# Patient Record
Sex: Female | Born: 1945 | Race: White | Hispanic: No | Marital: Single | Smoking: Never smoker
Health system: Southern US, Community
[De-identification: ages and names within clinical notes are randomized; demographics above are authoritative.]

---

## 2005-02-25 ENCOUNTER — Ambulatory Visit: Payer: Self-pay | Admitting: Unknown Physician Specialty

## 2005-07-14 ENCOUNTER — Ambulatory Visit: Payer: Self-pay | Admitting: General Surgery

## 2006-08-11 ENCOUNTER — Ambulatory Visit: Payer: Self-pay | Admitting: General Surgery

## 2007-07-24 ENCOUNTER — Ambulatory Visit: Payer: Self-pay | Admitting: General Surgery

## 2008-08-08 ENCOUNTER — Ambulatory Visit: Payer: Self-pay | Admitting: General Surgery

## 2009-08-10 ENCOUNTER — Ambulatory Visit: Payer: Self-pay | Admitting: General Surgery

## 2019-06-03 ENCOUNTER — Encounter: Payer: Self-pay | Admitting: Podiatry

## 2019-06-03 ENCOUNTER — Other Ambulatory Visit: Payer: Self-pay

## 2019-06-03 ENCOUNTER — Ambulatory Visit (INDEPENDENT_AMBULATORY_CARE_PROVIDER_SITE_OTHER): Payer: Medicare Other | Admitting: Podiatry

## 2019-06-03 VITALS — Temp 98.0°F

## 2019-06-03 DIAGNOSIS — M79676 Pain in unspecified toe(s): Secondary | ICD-10-CM

## 2019-06-03 DIAGNOSIS — B351 Tinea unguium: Secondary | ICD-10-CM

## 2019-06-03 NOTE — Progress Notes (Signed)
  Subjective:  Patient ID: Crystal Bartlett, female    DOB: 1946/09/24,  MRN: [id number] HPI Chief Complaint  Patient presents with  . Nail Problem    nail trim, long thick curvy nails, possible ingrown toenail left hallux.  No treatment done    73 y.o. female presents with the above complaint.   ROS: Denies fever chills nausea vomiting muscle aches pains calf pain back pain chest pain shortness of breath.  No past medical history on file.   Current Outpatient Medications:  .  acetaminophen (TYLENOL) 325 MG tablet, Take 650 mg by mouth every 6 (six) hours as needed., Disp: , Rfl:   No Known Allergies Review of Systems Objective:   Vitals:   06/03/19 0816  Temp: 62 F (36.7 C)    General: Well developed, nourished, in no acute distress, alert and oriented x3   Dermatological: Skin is warm, dry and supple bilateral. Nails x 10 are well maintained; remaining integument appears unremarkable at this time. There are no open sores, no preulcerative lesions, no rash or signs of infection present.  Toenails are grossly thickened and elongated approximately 2-1/2 inches long from the tip of the toe.  No open lesions or wounds  Vascular: Dorsalis Pedis artery and Posterior Tibial artery pedal pulses are 2/4 bilateral with immedate capillary fill time. Pedal hair growth present. No varicosities and no lower extremity edema present bilateral.   Neruologic: Grossly intact via light touch bilateral. Vibratory intact via tuning fork bilateral. Protective threshold with Semmes Wienstein monofilament intact to all pedal sites bilateral. Patellar and Achilles deep tendon reflexes 2+ bilateral. No Babinski or clonus noted bilateral.   Musculoskeletal: No gross boney pedal deformities bilateral. No pain, crepitus, or limitation noted with foot and ankle range of motion bilateral. Muscular strength 5/5 in all groups tested bilateral.  Gait: Unassisted, Nonantalgic.    Radiographs:  None taken   Assessment & Plan:   Assessment: Grossly elongated painful mycotic nails  Plan: Debrided toenails 1 through 5 bilateral reappoint to Dr. Sharyon Cable.     Max T. [place], [place]

## 2019-09-02 ENCOUNTER — Ambulatory Visit (INDEPENDENT_AMBULATORY_CARE_PROVIDER_SITE_OTHER): Payer: Medicare Other | Admitting: Podiatry

## 2019-09-02 ENCOUNTER — Other Ambulatory Visit: Payer: Self-pay

## 2019-09-02 ENCOUNTER — Encounter: Payer: Self-pay | Admitting: Podiatry

## 2019-09-02 DIAGNOSIS — M79676 Pain in unspecified toe(s): Secondary | ICD-10-CM | POA: Diagnosis not present

## 2019-09-02 DIAGNOSIS — B351 Tinea unguium: Secondary | ICD-10-CM

## 2019-09-02 NOTE — Progress Notes (Signed)
Complaint:  Visit Type: Patient returns to my office for continued preventative foot care services. Complaint: Patient states" my nails have grown long and thick and become painful to walk and wear shoes" . The patient presents for preventative foot care services. No changes to ROS  Podiatric Exam: Vascular: dorsalis pedis and posterior tibial pulses are palpable bilateral. Capillary return is immediate. Temperature gradient is WNL. Skin turgor WNL  Sensorium: Normal Semmes Weinstein monofilament test. Normal tactile sensation bilaterally. Nail Exam: Pt has thick disfigured discolored nails with subungual debris noted bilateral entire nail hallux through fifth toenails Ulcer Exam: There is no evidence of ulcer or pre-ulcerative changes or infection. Orthopedic Exam: Muscle tone and strength are WNL. No limitations in general ROM. No crepitus or effusions noted. Foot type and digits show no abnormalities. Bony prominences are unremarkable. Skin: No Porokeratosis. No infection or ulcers  Diagnosis:  Onychomycosis, , Pain in right toe, pain in left toes  Treatment & Plan Procedures and Treatment: Consent by patient was obtained for treatment procedures.   Debridement of mycotic and hypertrophic toenails, 1 through 5 bilateral and clearing of subungual debris. No ulceration, no infection noted.  Return Visit-Office Procedure: Patient instructed to return to the office for a follow up visit 3 months for continued evaluation and treatment.    Ameliyah Sarno DPM 

## 2019-12-02 ENCOUNTER — Ambulatory Visit (INDEPENDENT_AMBULATORY_CARE_PROVIDER_SITE_OTHER): Payer: Medicare Other | Admitting: Podiatry

## 2019-12-02 ENCOUNTER — Other Ambulatory Visit: Payer: Self-pay

## 2019-12-02 ENCOUNTER — Encounter: Payer: Self-pay | Admitting: Podiatry

## 2019-12-02 DIAGNOSIS — M79676 Pain in unspecified toe(s): Secondary | ICD-10-CM

## 2019-12-02 DIAGNOSIS — B351 Tinea unguium: Secondary | ICD-10-CM

## 2019-12-02 NOTE — Progress Notes (Signed)
Complaint:  Visit Type: Patient returns to my office for continued preventative foot care services. Complaint: Patient states" my nails have grown long and thick and become painful to walk and wear shoes" . The patient presents for preventative foot care services. No changes to ROS  Podiatric Exam: Vascular: dorsalis pedis and posterior tibial pulses are palpable bilateral. Capillary return is immediate. Temperature gradient is WNL. Skin turgor WNL  Sensorium: Normal Semmes Weinstein monofilament test. Normal tactile sensation bilaterally. Nail Exam: Pt has thick disfigured discolored nails with subungual debris noted bilateral entire nail hallux through fifth toenails Ulcer Exam: There is no evidence of ulcer or pre-ulcerative changes or infection. Orthopedic Exam: Muscle tone and strength are WNL. No limitations in general ROM. No crepitus or effusions noted. Foot type and digits show no abnormalities. Bony prominences are unremarkable. Skin: No Porokeratosis. No infection or ulcers  Diagnosis:  Onychomycosis, , Pain in right toe, pain in left toes  Treatment & Plan Procedures and Treatment: Consent by patient was obtained for treatment procedures.   Debridement of mycotic and hypertrophic toenails, 1 through 5 bilateral and clearing of subungual debris. No ulceration, no infection noted.  Return Visit-Office Procedure: Patient instructed to return to the office for a follow up visit 3 months for continued evaluation and treatment.    Oaklee Sunga DPM 

## 2020-03-02 ENCOUNTER — Ambulatory Visit: Payer: Medicare Other | Admitting: Podiatry

## 2020-03-09 ENCOUNTER — Other Ambulatory Visit: Payer: Self-pay

## 2020-03-09 ENCOUNTER — Ambulatory Visit (INDEPENDENT_AMBULATORY_CARE_PROVIDER_SITE_OTHER): Payer: Medicare Other | Admitting: Podiatry

## 2020-03-09 ENCOUNTER — Encounter: Payer: Self-pay | Admitting: Podiatry

## 2020-03-09 VITALS — Temp 97.7°F

## 2020-03-09 DIAGNOSIS — B351 Tinea unguium: Secondary | ICD-10-CM

## 2020-03-09 DIAGNOSIS — M79676 Pain in unspecified toe(s): Secondary | ICD-10-CM | POA: Diagnosis not present

## 2020-03-09 NOTE — Progress Notes (Signed)
This patient returns to the office for evaluation and treatment of long thick painful nails .  This patient is unable to trim his own nails since the patient cannot reach the feet.  Patient says the nails are painful walking and wearing his shoes.  She returns for preventive foot care services.  General Appearance  Alert, conversant and in no acute stress.  Vascular  Dorsalis pedis and posterior tibial  pulses are palpable  bilaterally.  Capillary return is within normal limits  bilaterally. Temperature is within normal limits  bilaterally.  Neurologic  Senn-Weinstein monofilament wire test within normal limits  bilaterally. Muscle power within normal limits bilaterally.  Nails Thick disfigured discolored nails with subungual debris  from hallux to fifth toes bilaterally. No evidence of bacterial infection or drainage bilaterally.  Orthopedic  No limitations of motion  feet .  No crepitus or effusions noted.  No bony pathology or digital deformities noted.  Skin  normotropic skin with no porokeratosis noted bilaterally.  No signs of infections or ulcers noted.     Onychomycosis  Pain in toes right foot  Pain in toes left foot  Debridement  of nails  1-5  B/L with a nail nipper.  Nails were then filed using a dremel tool with no incidents.    RTC 3 months    Earnest Mcgillis DPM  

## 2020-06-11 ENCOUNTER — Ambulatory Visit: Payer: Medicare Other | Admitting: Podiatry

## 2020-06-18 ENCOUNTER — Ambulatory Visit: Payer: Medicare Other | Admitting: Podiatry

## 2020-06-25 ENCOUNTER — Ambulatory Visit (INDEPENDENT_AMBULATORY_CARE_PROVIDER_SITE_OTHER): Payer: Medicare Other | Admitting: Podiatry

## 2020-06-25 ENCOUNTER — Encounter: Payer: Self-pay | Admitting: Podiatry

## 2020-06-25 ENCOUNTER — Other Ambulatory Visit: Payer: Self-pay

## 2020-06-25 DIAGNOSIS — M79676 Pain in unspecified toe(s): Secondary | ICD-10-CM | POA: Diagnosis not present

## 2020-06-25 DIAGNOSIS — B351 Tinea unguium: Secondary | ICD-10-CM

## 2020-06-25 NOTE — Progress Notes (Signed)
This patient returns to the office for evaluation and treatment of long thick painful nails .  This patient is unable to trim his own nails since the patient cannot reach the feet.  Patient says the nails are painful walking and wearing his shoes.  She returns for preventive foot care services.  General Appearance  Alert, conversant and in no acute stress.  Vascular  Dorsalis pedis and posterior tibial  pulses are palpable  bilaterally.  Capillary return is within normal limits  bilaterally. Temperature is within normal limits  bilaterally.  Neurologic  Senn-Weinstein monofilament wire test within normal limits  bilaterally. Muscle power within normal limits bilaterally.  Nails Thick disfigured discolored nails with subungual debris  from hallux to fifth toes bilaterally. No evidence of bacterial infection or drainage bilaterally.  Orthopedic  No limitations of motion  feet .  No crepitus or effusions noted.  No bony pathology or digital deformities noted.  Skin  normotropic skin with no porokeratosis noted bilaterally.  No signs of infections or ulcers noted.     Onychomycosis  Pain in toes right foot  Pain in toes left foot  Debridement  of nails  1-5  B/L with a nail nipper.  Nails were then filed using a dremel tool with no incidents.    RTC 3 months    Helane Gunther DPM

## 2020-10-08 ENCOUNTER — Other Ambulatory Visit: Payer: Self-pay

## 2020-10-08 ENCOUNTER — Encounter: Payer: Self-pay | Admitting: Podiatry

## 2020-10-08 ENCOUNTER — Ambulatory Visit (INDEPENDENT_AMBULATORY_CARE_PROVIDER_SITE_OTHER): Payer: Medicare Other | Admitting: Podiatry

## 2020-10-08 DIAGNOSIS — B351 Tinea unguium: Secondary | ICD-10-CM

## 2020-10-08 DIAGNOSIS — M79676 Pain in unspecified toe(s): Secondary | ICD-10-CM | POA: Diagnosis not present

## 2020-10-08 NOTE — Progress Notes (Signed)
This patient returns to the office for evaluation and treatment of long thick painful nails .  This patient is unable to trim his own nails since the patient cannot reach the feet.  Patient says the nails are painful walking and wearing his shoes.  She returns for preventive foot care services.  General Appearance  Alert, conversant and in no acute stress.  Vascular  Dorsalis pedis and posterior tibial  pulses are palpable  bilaterally.  Capillary return is within normal limits  bilaterally. Temperature is within normal limits  bilaterally.  Neurologic  Senn-Weinstein monofilament wire test within normal limits  bilaterally. Muscle power within normal limits bilaterally.  Nails Thick disfigured discolored nails with subungual debris  from hallux to fifth toes bilaterally. No evidence of bacterial infection or drainage bilaterally.  Orthopedic  No limitations of motion  feet .  No crepitus or effusions noted.  No bony pathology or digital deformities noted.  Skin  normotropic skin with no porokeratosis noted bilaterally.  No signs of infections or ulcers noted.     Onychomycosis  Pain in toes right foot  Pain in toes left foot  Debridement  of nails  1-5  B/L with a nail nipper.  Nails were then filed using a dremel tool with no incidents.    RTC 3 months    Helane Gunther DPM

## 2021-01-11 ENCOUNTER — Other Ambulatory Visit: Payer: Self-pay

## 2021-01-11 ENCOUNTER — Encounter: Payer: Self-pay | Admitting: Podiatry

## 2021-01-11 ENCOUNTER — Ambulatory Visit (INDEPENDENT_AMBULATORY_CARE_PROVIDER_SITE_OTHER): Payer: Medicare Other | Admitting: Podiatry

## 2021-01-11 DIAGNOSIS — B351 Tinea unguium: Secondary | ICD-10-CM | POA: Diagnosis not present

## 2021-01-11 DIAGNOSIS — M79676 Pain in unspecified toe(s): Secondary | ICD-10-CM | POA: Diagnosis not present

## 2021-01-11 NOTE — Progress Notes (Signed)

## 2021-02-09 ENCOUNTER — Other Ambulatory Visit: Payer: Self-pay | Admitting: Internal Medicine

## 2021-02-09 DIAGNOSIS — Z1231 Encounter for screening mammogram for malignant neoplasm of breast: Secondary | ICD-10-CM

## 2021-03-23 ENCOUNTER — Ambulatory Visit
Admission: RE | Admit: 2021-03-23 | Discharge: 2021-03-23 | Disposition: A | Discharge status: Home / Self Care | Payer: Medicare Other | Source: Ambulatory Visit | Attending: Internal Medicine | Admitting: Internal Medicine

## 2021-03-23 ENCOUNTER — Other Ambulatory Visit: Payer: Self-pay

## 2021-03-23 DIAGNOSIS — Z1231 Encounter for screening mammogram for malignant neoplasm of breast: Secondary | ICD-10-CM

## 2021-04-02 ENCOUNTER — Encounter: Payer: Self-pay | Admitting: Podiatry

## 2021-04-12 ENCOUNTER — Ambulatory Visit: Payer: Medicare Other | Admitting: Podiatry

## 2021-04-15 ENCOUNTER — Ambulatory Visit: Payer: Medicare Other | Admitting: Podiatry

## 2021-05-06 ENCOUNTER — Other Ambulatory Visit: Payer: Self-pay

## 2021-05-06 ENCOUNTER — Ambulatory Visit (INDEPENDENT_AMBULATORY_CARE_PROVIDER_SITE_OTHER): Payer: Medicare Other | Admitting: Podiatry

## 2021-05-06 ENCOUNTER — Encounter: Payer: Self-pay | Admitting: Podiatry

## 2021-05-06 DIAGNOSIS — B351 Tinea unguium: Secondary | ICD-10-CM | POA: Diagnosis not present

## 2021-05-06 DIAGNOSIS — M79676 Pain in unspecified toe(s): Secondary | ICD-10-CM | POA: Diagnosis not present

## 2021-05-06 NOTE — Progress Notes (Signed)

## 2021-06-24 IMAGING — MG MM DIGITAL SCREENING BILAT W/ TOMO AND CAD
6 of 10 series · 6 of 30 positions shown · non-contrast
Comparison: Previous exam(s).

CLINICAL DATA: Screening.

EXAM:
DIGITAL SCREENING BILATERAL MAMMOGRAM WITH TOMOSYNTHESIS AND CAD
TECHNIQUE: Bilateral screening digital craniocaudal and mediolateral oblique
mammograms were obtained. Bilateral screening digital breast
tomosynthesis was performed. The images were evaluated with
computer-aided detection.

[L MLO synth-2D]
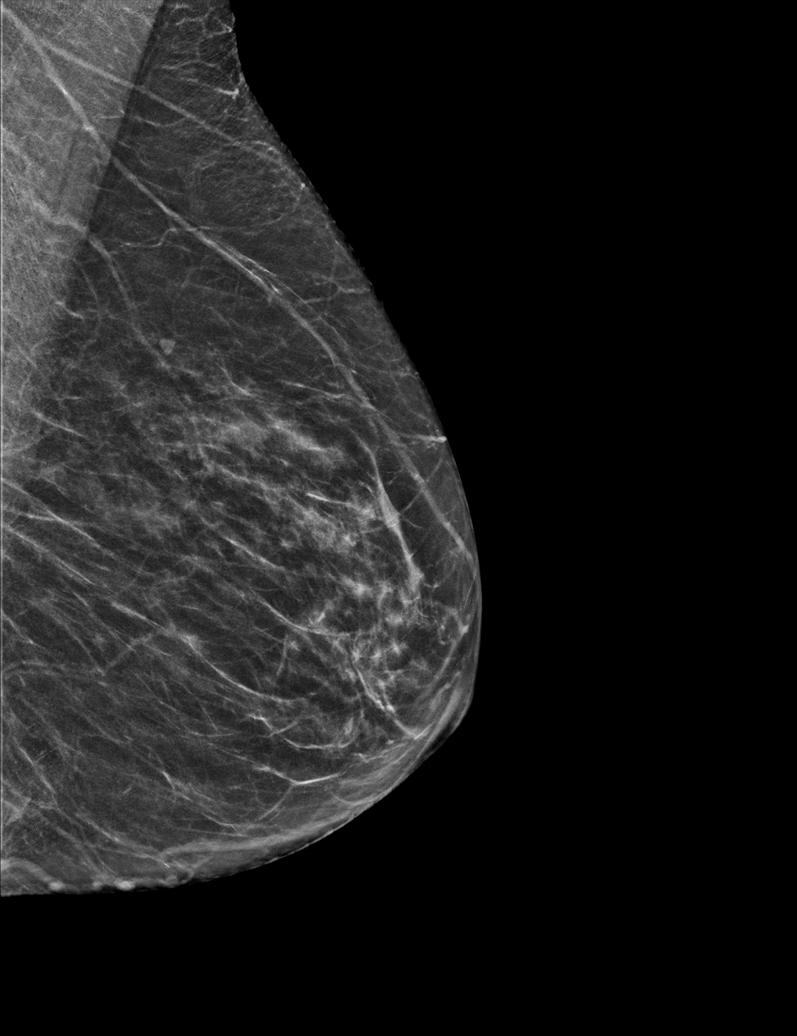

[R MLO synth-2D (1 of 2)]
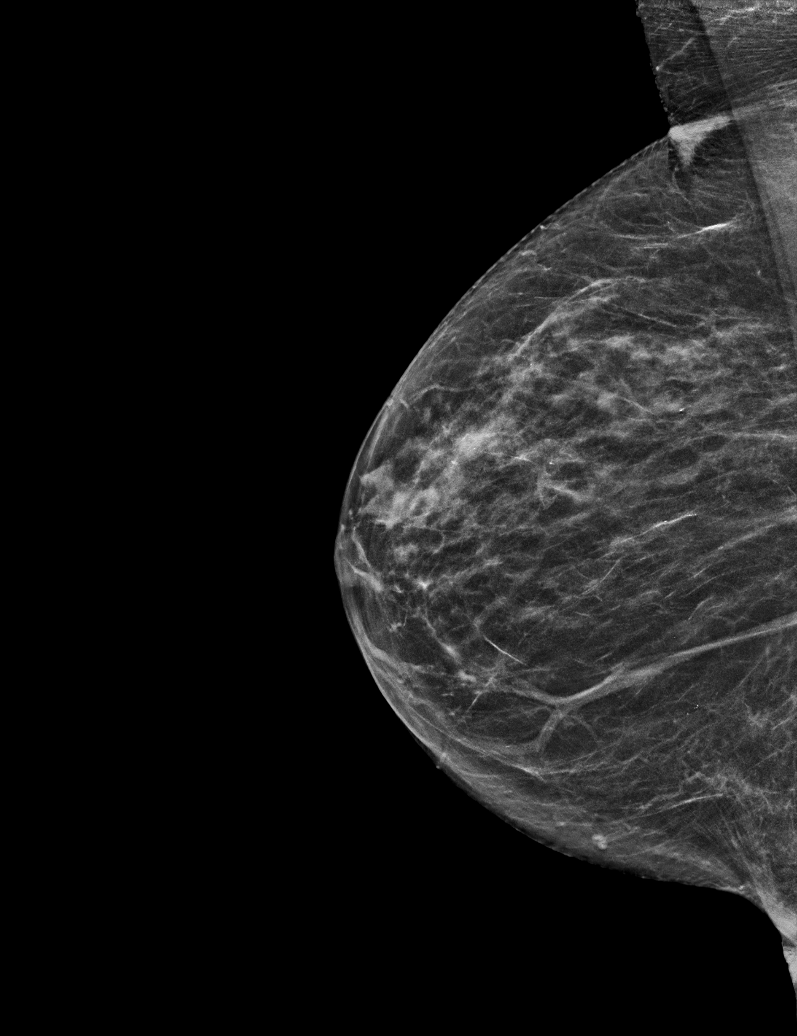

[R CC synth-2D]
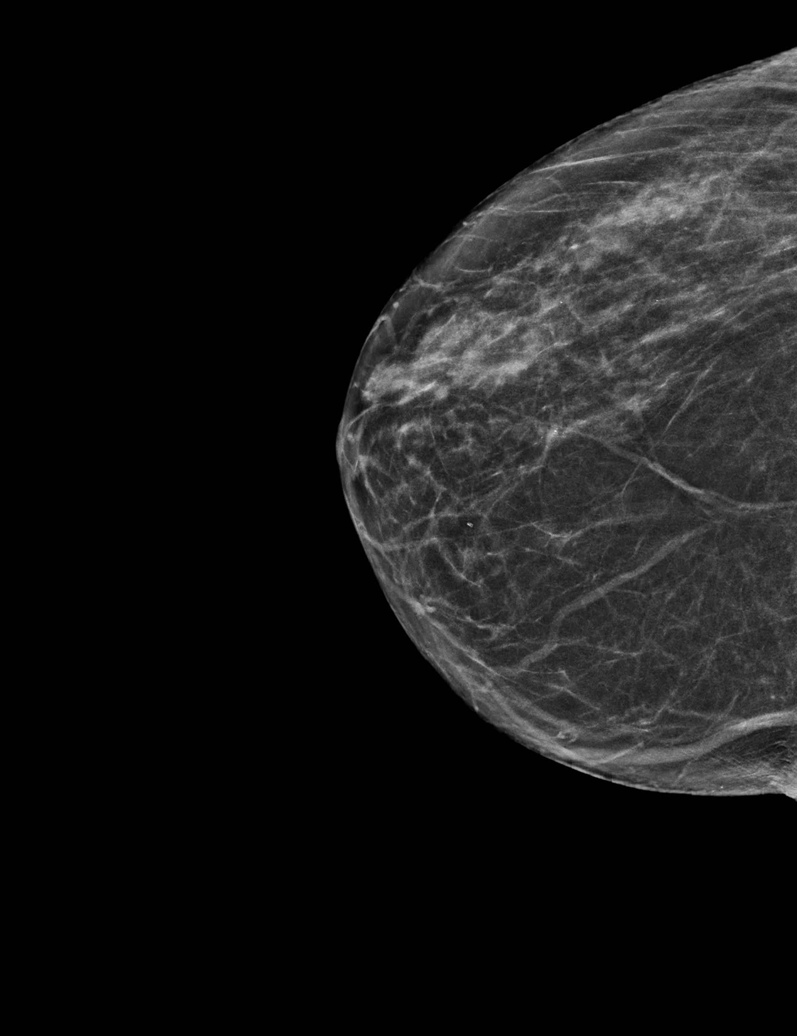

[L CC synth-2D]
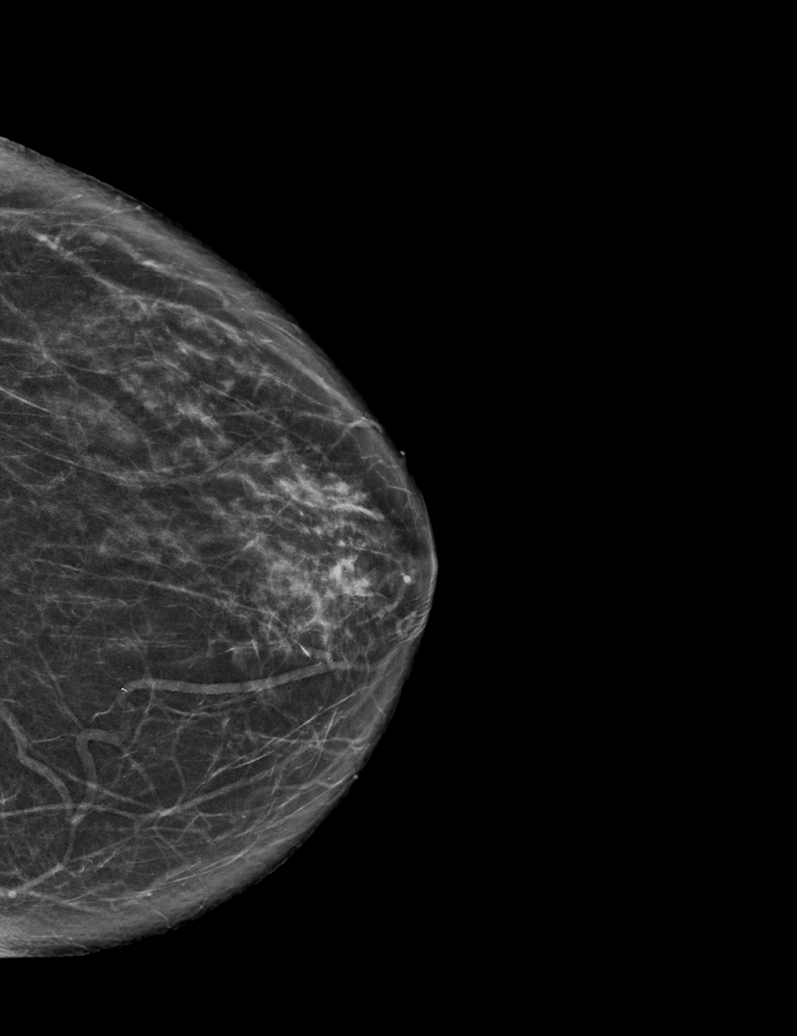

[R MLO synth-2D (2 of 2)]
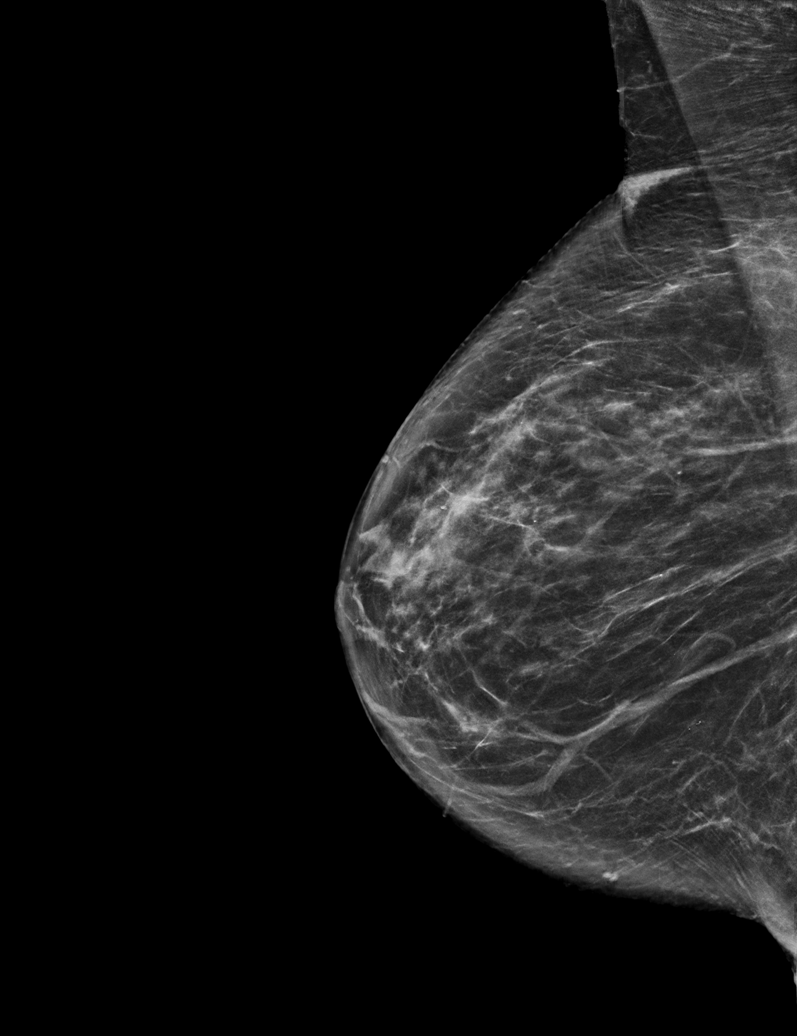

[L CC tomo · tomo slice 31/62.0]
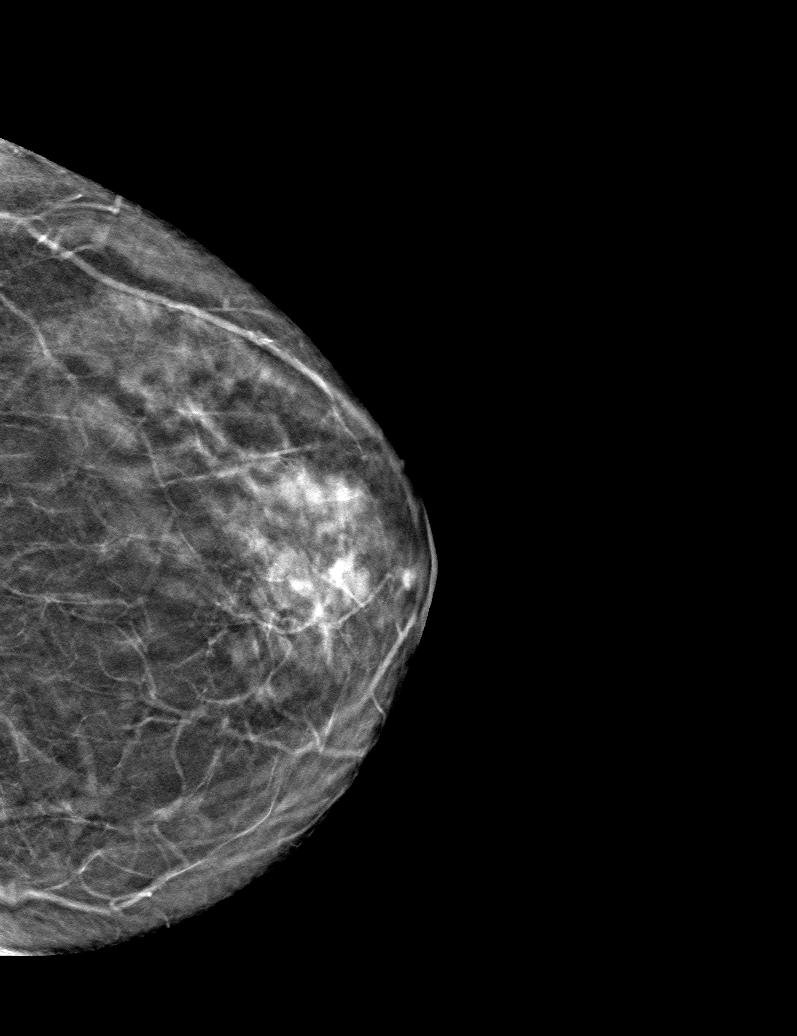

[6 of 30 positions shown; findings below may reference images not displayed]

ACR Breast Density Category b: There are scattered areas of
fibroglandular density.
FINDINGS: There are no findings suspicious for malignancy. The images were
evaluated with computer-aided detection.
IMPRESSION: No mammographic evidence of malignancy. A result letter of this
screening mammogram will be mailed directly to the patient.

RECOMMENDATION:
Screening mammogram in one year. (Code:WJ-I-BG6)

BI-RADS CATEGORY  1: Negative.

## 2021-07-29 ENCOUNTER — Telehealth: Payer: Self-pay | Admitting: Podiatry

## 2021-07-29 ENCOUNTER — Encounter: Payer: Self-pay | Admitting: Podiatry

## 2021-07-29 NOTE — Telephone Encounter (Signed)
Called patient lvm to reschedule 9/26 appt and sent reschedule letter  

## 2021-09-06 ENCOUNTER — Ambulatory Visit: Payer: Medicare Other | Admitting: Podiatry

## 2021-10-07 ENCOUNTER — Ambulatory Visit (INDEPENDENT_AMBULATORY_CARE_PROVIDER_SITE_OTHER): Payer: Medicare Other | Admitting: Podiatry

## 2021-10-07 ENCOUNTER — Other Ambulatory Visit: Payer: Self-pay

## 2021-10-07 ENCOUNTER — Encounter: Payer: Self-pay | Admitting: Podiatry

## 2021-10-07 ENCOUNTER — Encounter (INDEPENDENT_AMBULATORY_CARE_PROVIDER_SITE_OTHER): Payer: Self-pay

## 2021-10-07 DIAGNOSIS — M79676 Pain in unspecified toe(s): Secondary | ICD-10-CM

## 2021-10-07 DIAGNOSIS — B351 Tinea unguium: Secondary | ICD-10-CM

## 2021-10-07 NOTE — Progress Notes (Signed)
This patient returns to the office for evaluation and treatment of long thick painful nails .  This patient is unable to trim her own nails since the patient cannot reach her feet.  Patient says the nails are painful walking and wearing his shoes.  She returns for preventive foot care services.  General Appearance  Alert, conversant and in no acute stress.  Vascular  Dorsalis pedis and posterior tibial  pulses are palpable  bilaterally.  Capillary return is within normal limits  bilaterally. Temperature is within normal limits  bilaterally.  Neurologic  Senn-Weinstein monofilament wire test within normal limits  bilaterally. Muscle power within normal limits bilaterally.  Nails Thick disfigured discolored nails with subungual debris  from hallux to fifth toes bilaterally. No evidence of bacterial infection or drainage bilaterally.  Orthopedic  No limitations of motion  feet .  No crepitus or effusions noted.  No bony pathology or digital deformities noted.  Skin  normotropic skin with no porokeratosis noted bilaterally.  No signs of infections or ulcers noted.     Onychomycosis  Pain in toes right foot  Pain in toes left foot  Debridement  of nails  1-5  B/L with a nail nipper.  Nails were then filed using a dremel tool with no incidents.    RTC 5 months    Helane Gunther DPM

## 2021-11-08 ENCOUNTER — Inpatient Hospital Stay (HOSPITAL_COMMUNITY): Payer: Medicare Other | Admitting: Anesthesiology

## 2021-11-08 ENCOUNTER — Emergency Department: Payer: Medicare Other

## 2021-11-08 ENCOUNTER — Emergency Department
Admission: EM | Admit: 2021-11-08 | Discharge: 2021-11-08 | Disposition: A | Discharge status: Other Acute Inpatient Hospital | Payer: Medicare Other | Attending: Emergency Medicine | Admitting: Emergency Medicine

## 2021-11-08 ENCOUNTER — Ambulatory Visit (HOSPITAL_COMMUNITY)
Admission: EM | Admit: 2021-11-08 | Discharge: 2021-11-11 | DRG: 300 | Disposition: E | Payer: Medicare Other | Source: Other Acute Inpatient Hospital | Attending: Thoracic Surgery (Cardiothoracic Vascular Surgery) | Admitting: Thoracic Surgery (Cardiothoracic Vascular Surgery)

## 2021-11-08 ENCOUNTER — Other Ambulatory Visit: Payer: Self-pay

## 2021-11-08 ENCOUNTER — Encounter: Payer: Self-pay | Admitting: Radiology

## 2021-11-08 DIAGNOSIS — R230 Cyanosis: Secondary | ICD-10-CM | POA: Diagnosis not present

## 2021-11-08 DIAGNOSIS — I314 Cardiac tamponade: Secondary | ICD-10-CM | POA: Diagnosis present

## 2021-11-08 DIAGNOSIS — I3139 Other pericardial effusion (noninflammatory): Secondary | ICD-10-CM | POA: Diagnosis present

## 2021-11-08 DIAGNOSIS — Z20822 Contact with and (suspected) exposure to covid-19: Secondary | ICD-10-CM | POA: Diagnosis not present

## 2021-11-08 DIAGNOSIS — I71012 Dissection of descending thoracic aorta: Secondary | ICD-10-CM | POA: Diagnosis present

## 2021-11-08 DIAGNOSIS — R579 Shock, unspecified: Secondary | ICD-10-CM | POA: Diagnosis not present

## 2021-11-08 DIAGNOSIS — R112 Nausea with vomiting, unspecified: Secondary | ICD-10-CM | POA: Diagnosis not present

## 2021-11-08 DIAGNOSIS — M546 Pain in thoracic spine: Secondary | ICD-10-CM | POA: Diagnosis present

## 2021-11-08 DIAGNOSIS — I10 Essential (primary) hypertension: Secondary | ICD-10-CM | POA: Diagnosis not present

## 2021-11-08 DIAGNOSIS — I7101 Dissection of ascending aorta: Secondary | ICD-10-CM | POA: Diagnosis present

## 2021-11-08 DIAGNOSIS — I71019 Dissection of thoracic aorta, unspecified: Secondary | ICD-10-CM | POA: Diagnosis not present

## 2021-11-08 DIAGNOSIS — I71011 Dissection of aortic arch: Secondary | ICD-10-CM | POA: Diagnosis present

## 2021-11-08 DIAGNOSIS — E872 Acidosis, unspecified: Secondary | ICD-10-CM | POA: Diagnosis present

## 2021-11-08 DIAGNOSIS — Z803 Family history of malignant neoplasm of breast: Secondary | ICD-10-CM

## 2021-11-08 DIAGNOSIS — I469 Cardiac arrest, cause unspecified: Secondary | ICD-10-CM | POA: Insufficient documentation

## 2021-11-08 DIAGNOSIS — R000 Tachycardia, unspecified: Secondary | ICD-10-CM | POA: Diagnosis not present

## 2021-11-08 LAB — CBC WITH DIFFERENTIAL/PLATELET
Abs Immature Granulocytes: 0.14 10*3/uL — ABNORMAL HIGH (ref 0.00–0.07)
Basophils Absolute: 0.2 10*3/uL — ABNORMAL HIGH (ref 0.0–0.1)
Basophils Relative: 1 %
Eosinophils Absolute: 0.4 10*3/uL (ref 0.0–0.5)
Eosinophils Relative: 2 %
HCT: 44.4 % (ref 36.0–46.0)
Hemoglobin: 14.2 g/dL (ref 12.0–15.0)
Immature Granulocytes: 1 %
Lymphocytes Relative: 20 %
Lymphs Abs: 3.9 10*3/uL (ref 0.7–4.0)
MCH: 28.7 pg (ref 26.0–34.0)
MCHC: 32 g/dL (ref 30.0–36.0)
MCV: 89.9 fL (ref 80.0–100.0)
Monocytes Absolute: 1 10*3/uL (ref 0.1–1.0)
Monocytes Relative: 5 %
Neutro Abs: 14.3 10*3/uL — ABNORMAL HIGH (ref 1.7–7.7)
Neutrophils Relative %: 71 %
Platelets: 183 10*3/uL (ref 150–400)
RBC: 4.94 MIL/uL (ref 3.87–5.11)
RDW: 13.2 % (ref 11.5–15.5)
WBC: 19.9 10*3/uL — ABNORMAL HIGH (ref 4.0–10.5)
nRBC: 0 % (ref 0.0–0.2)

## 2021-11-08 LAB — TYPE AND SCREEN
ABO/RH(D): AB NEG
Antibody Screen: NEGATIVE

## 2021-11-08 LAB — COMPREHENSIVE METABOLIC PANEL
ALT: 20 U/L (ref 0–44)
AST: 42 U/L — ABNORMAL HIGH (ref 15–41)
Albumin: 3.7 g/dL (ref 3.5–5.0)
Alkaline Phosphatase: 67 U/L (ref 38–126)
Anion gap: 14 (ref 5–15)
BUN: 21 mg/dL (ref 8–23)
CO2: 21 mmol/L — ABNORMAL LOW (ref 22–32)
Calcium: 9.7 mg/dL (ref 8.9–10.3)
Chloride: 102 mmol/L (ref 98–111)
Creatinine, Ser: 1.23 mg/dL — ABNORMAL HIGH (ref 0.44–1.00)
GFR, Estimated: 46 mL/min — ABNORMAL LOW (ref >60)
Glucose, Bld: 199 mg/dL — ABNORMAL HIGH (ref 70–99)
Potassium: 3 mmol/L — ABNORMAL LOW (ref 3.5–5.1)
Sodium: 137 mmol/L (ref 135–145)
Total Bilirubin: 0.9 mg/dL (ref 0.3–1.2)
Total Protein: 6.8 g/dL (ref 6.5–8.1)

## 2021-11-08 LAB — RESP PANEL BY RT-PCR (FLU A&B, COVID) ARPGX2
Influenza A by PCR: NEGATIVE
Influenza B by PCR: NEGATIVE
SARS Coronavirus 2 by RT PCR: NEGATIVE

## 2021-11-08 LAB — LIPASE, BLOOD: Lipase: 58 U/L — ABNORMAL HIGH (ref 11–51)

## 2021-11-08 LAB — TROPONIN I (HIGH SENSITIVITY): Troponin I (High Sensitivity): 5 ng/L (ref <18)

## 2021-11-08 LAB — MAGNESIUM: Magnesium: 2.4 mg/dL (ref 1.7–2.4)

## 2021-11-08 LAB — LACTIC ACID, PLASMA: Lactic Acid, Venous: 8.7 mmol/L (ref 0.5–1.9)

## 2021-11-08 MED ORDER — LACTATED RINGERS IV BOLUS
250.0000 mL | Freq: Once | INTRAVENOUS | Status: AC
  Administered 2021-11-08: 23:00:00 250 mL via INTRAVENOUS

## 2021-11-08 MED ORDER — DEXMEDETOMIDINE HCL IN NACL 400 MCG/100ML IV SOLN
0.1000 ug/kg/h | INTRAVENOUS | Status: DC
  Filled 2021-11-08 (×2): qty 100

## 2021-11-08 MED ORDER — TRANEXAMIC ACID 1000 MG/10ML IV SOLN
1.5000 mg/kg/h | INTRAVENOUS | Status: DC
  Filled 2021-11-08: qty 25

## 2021-11-08 MED ORDER — TRANEXAMIC ACID (OHS) PUMP PRIME SOLUTION
2.0000 mg/kg | INTRAVENOUS | Status: AC
  Filled 2021-11-08: qty 1.45

## 2021-11-08 MED ORDER — PROCHLORPERAZINE EDISYLATE 10 MG/2ML IJ SOLN
INTRAMUSCULAR | Status: AC
  Filled 2021-11-08: qty 2

## 2021-11-08 MED ORDER — ESMOLOL HCL-SODIUM CHLORIDE 2000 MG/100ML IV SOLN
50.0000 ug/kg/min | INTRAVENOUS | Status: DC
Start: 2021-11-08 — End: 2021-11-08
  Administered 2021-11-08: 23:00:00 25 ug/kg/min via INTRAVENOUS
  Filled 2021-11-08: qty 100

## 2021-11-08 MED ORDER — VANCOMYCIN HCL 1250 MG/250ML IV SOLN
1250.0000 mg | INTRAVENOUS | Status: DC
  Filled 2021-11-08: qty 250

## 2021-11-08 MED ORDER — TRANEXAMIC ACID (OHS) PUMP PRIME SOLUTION
2.0000 mg/kg | INTRAVENOUS | Status: DC
  Filled 2021-11-08 (×2): qty 1.45

## 2021-11-08 MED ORDER — PLASMA-LYTE A IV SOLN
INTRAVENOUS | Status: DC
  Filled 2021-11-08: qty 5

## 2021-11-08 MED ORDER — IOHEXOL 350 MG/ML SOLN
100.0000 mL | Freq: Once | INTRAVENOUS | Status: AC | PRN
  Administered 2021-11-08: 23:00:00 100 mL via INTRAVENOUS

## 2021-11-08 MED ORDER — VANCOMYCIN HCL IN DEXTROSE 1-5 GM/200ML-% IV SOLN
1000.0000 mg | Freq: Once | INTRAVENOUS | Status: AC
  Administered 2021-11-08: 23:00:00 1000 mg via INTRAVENOUS

## 2021-11-08 MED ORDER — POTASSIUM CHLORIDE 2 MEQ/ML IV SOLN
80.0000 meq | INTRAVENOUS | Status: AC
  Filled 2021-11-08: qty 40

## 2021-11-08 MED ORDER — VASOPRESSIN 20 UNITS/100 ML INFUSION FOR SHOCK
0.0000 [IU]/min | INTRAVENOUS | Status: DC
  Filled 2021-11-08: qty 100

## 2021-11-08 MED ORDER — NOREPINEPHRINE 4 MG/250ML-% IV SOLN
0.0000 ug/min | INTRAVENOUS | Status: DC
Start: 2021-11-08 — End: 2021-11-08
  Filled 2021-11-08: qty 250

## 2021-11-08 MED ORDER — INSULIN REGULAR(HUMAN) IN NACL 100-0.9 UT/100ML-% IV SOLN
INTRAVENOUS | Status: DC
  Filled 2021-11-08: qty 100

## 2021-11-08 MED ORDER — TRANEXAMIC ACID 1000 MG/10ML IV SOLN
1.5000 mg/kg/h | INTRAVENOUS | Status: AC
  Filled 2021-11-08 (×3): qty 25

## 2021-11-08 MED ORDER — MAGNESIUM SULFATE 50 % IJ SOLN
40.0000 meq | INTRAMUSCULAR | Status: DC
  Filled 2021-11-08: qty 9.85

## 2021-11-08 MED ORDER — CEFAZOLIN SODIUM-DEXTROSE 2-4 GM/100ML-% IV SOLN
2.0000 g | INTRAVENOUS | Status: DC
  Filled 2021-11-08: qty 100

## 2021-11-08 MED ORDER — VANCOMYCIN HCL IN DEXTROSE 1-5 GM/200ML-% IV SOLN: 1000.0000 mg | Freq: Once | INTRAVENOUS | Status: DC

## 2021-11-08 MED ORDER — MANNITOL 20 % IV SOLN: INTRAVENOUS | Status: DC

## 2021-11-08 MED ORDER — PLASMA-LYTE A IV SOLN
INTRAVENOUS | Status: AC
  Filled 2021-11-08: qty 5

## 2021-11-08 MED ORDER — HEPARIN 30,000 UNITS/1000 ML (OHS) CELLSAVER SOLUTION
Status: AC
  Filled 2021-11-08: qty 1000

## 2021-11-08 MED ORDER — SODIUM CHLORIDE 0.9 % IV SOLN
2.0000 g | Freq: Once | INTRAVENOUS | Status: AC
  Administered 2021-11-08: 22:00:00 2 g via INTRAVENOUS

## 2021-11-08 MED ORDER — POTASSIUM CHLORIDE 2 MEQ/ML IV SOLN
80.0000 meq | INTRAVENOUS | Status: DC
  Filled 2021-11-08: qty 40

## 2021-11-08 MED ORDER — MANNITOL 20 % IV SOLN
INTRAVENOUS | Status: DC
  Filled 2021-11-08: qty 13

## 2021-11-08 MED ORDER — INSULIN REGULAR(HUMAN) IN NACL 100-0.9 UT/100ML-% IV SOLN
INTRAVENOUS | Status: AC
  Filled 2021-11-08: qty 100

## 2021-11-08 MED ORDER — LACTATED RINGERS IV BOLUS
1000.0000 mL | Freq: Once | INTRAVENOUS | Status: AC
  Administered 2021-11-08: 22:00:00 1000 mL via INTRAVENOUS

## 2021-11-08 MED ORDER — METRONIDAZOLE 500 MG/100ML IV SOLN
500.0000 mg | Freq: Once | INTRAVENOUS | Status: AC
  Administered 2021-11-08: 22:00:00 500 mg via INTRAVENOUS

## 2021-11-08 MED ORDER — MILRINONE LACTATE IN DEXTROSE 20-5 MG/100ML-% IV SOLN
0.3000 ug/kg/min | INTRAVENOUS | Status: AC
  Filled 2021-11-08: qty 100

## 2021-11-08 MED ORDER — MILRINONE LACTATE IN DEXTROSE 20-5 MG/100ML-% IV SOLN
0.3000 ug/kg/min | INTRAVENOUS | Status: DC
  Filled 2021-11-08: qty 100

## 2021-11-08 MED ORDER — PROCHLORPERAZINE EDISYLATE 10 MG/2ML IJ SOLN
10.0000 mg | Freq: Once | INTRAMUSCULAR | Status: AC
  Administered 2021-11-08: 23:00:00 10 mg via INTRAVENOUS

## 2021-11-08 MED ORDER — NOREPINEPHRINE 4 MG/250ML-% IV SOLN
0.0000 ug/min | INTRAVENOUS | Status: AC
  Filled 2021-11-08: qty 250

## 2021-11-08 MED ORDER — NITROGLYCERIN IN D5W 200-5 MCG/ML-% IV SOLN
2.0000 ug/min | INTRAVENOUS | Status: DC
  Filled 2021-11-08: qty 250

## 2021-11-08 MED ORDER — HEPARIN 30,000 UNITS/1000 ML (OHS) CELLSAVER SOLUTION
Status: DC
  Filled 2021-11-08: qty 1000

## 2021-11-08 MED ORDER — NITROGLYCERIN IN D5W 200-5 MCG/ML-% IV SOLN
2.0000 ug/min | INTRAVENOUS | Status: AC
  Filled 2021-11-08: qty 250

## 2021-11-08 MED ORDER — EPINEPHRINE HCL 5 MG/250ML IV SOLN IN NS
0.0000 ug/min | INTRAVENOUS | Status: DC
  Filled 2021-11-08: qty 250

## 2021-11-08 MED ORDER — CEFAZOLIN SODIUM-DEXTROSE 2-4 GM/100ML-% IV SOLN
2.0000 g | INTRAVENOUS | Status: AC
  Filled 2021-11-08: qty 100

## 2021-11-08 MED ORDER — CEFAZOLIN SODIUM-DEXTROSE 2-4 GM/100ML-% IV SOLN: 2.0000 g | INTRAVENOUS | Status: DC

## 2021-11-08 MED ORDER — ONDANSETRON HCL 4 MG/2ML IJ SOLN
4.0000 mg | Freq: Once | INTRAMUSCULAR | Status: AC
  Administered 2021-11-08: 22:00:00 4 mg via INTRAVENOUS
  Filled 2021-11-08: qty 2

## 2021-11-08 MED ORDER — TRANEXAMIC ACID (OHS) BOLUS VIA INFUSION
15.0000 mg/kg | INTRAVENOUS | Status: DC
  Filled 2021-11-08: qty 1089

## 2021-11-08 MED ORDER — PHENYLEPHRINE HCL-NACL 20-0.9 MG/250ML-% IV SOLN
30.0000 ug/min | INTRAVENOUS | Status: DC
Start: 2021-11-08 — End: 2021-11-08
  Filled 2021-11-08: qty 250

## 2021-11-08 MED ORDER — TRANEXAMIC ACID (OHS) BOLUS VIA INFUSION
15.0000 mg/kg | INTRAVENOUS | Status: AC
  Filled 2021-11-08: qty 1089

## 2021-11-08 MED ORDER — DEXMEDETOMIDINE HCL IN NACL 400 MCG/100ML IV SOLN
0.1000 ug/kg/h | INTRAVENOUS | Status: AC
  Filled 2021-11-08: qty 100

## 2021-11-08 MED ORDER — EPINEPHRINE HCL 5 MG/250ML IV SOLN IN NS
0.0000 ug/min | INTRAVENOUS | Status: AC
  Filled 2021-11-08: qty 250

## 2021-11-08 MED ORDER — PHENYLEPHRINE HCL-NACL 20-0.9 MG/250ML-% IV SOLN
30.0000 ug/min | INTRAVENOUS | Status: AC
  Filled 2021-11-08: qty 250

## 2021-11-08 NOTE — Sepsis Progress Note (Signed)
[facility] following Code sepsis  

## 2021-11-08 NOTE — ED Notes (Signed)
Pt Accepted to [facility] OR per Delice Bison, call [phone] for report. Accepting [employer]

## 2021-11-08 NOTE — ED Provider Notes (Signed)
[facility] Emergency Department Provider Note   ____________________________________________   Event Date/Time   First MD Initiated Contact with Patient Nov 22, 2021 2154     (approximate)  I have reviewed the triage vital signs and the nursing notes.   HISTORY  Chief Complaint Back Pain    HPI Crystal Bartlett is a 75 y.o. female with past medical history of hypertension who presents to the ED complaining of vomiting and back pain.  EMS reports that they were called to the patient's home due to acute onset of nausea and vomiting.  Patient also reports pain in her right upper back moving towards her right shoulder, denies any recent trauma.  She denies any pain in her chest or abdomen, appears uncomfortable and has a difficult time providing additional history.        History reviewed. No pertinent past medical history.  There are no problems to display for this patient.   No past surgical history on file.  Prior to Admission medications   Medication Sig Start Date End Date Taking? Authorizing Provider  acetaminophen (TYLENOL) 325 MG tablet Take 650 mg by mouth every 6 (six) hours as needed.    [provider]    Allergies Patient has no known allergies.  Family History  Problem Relation Age of Onset   Breast cancer Sister     Social History Social History   Tobacco Use   Smoking status: Never   Smokeless tobacco: Never  Substance Use Topics   Alcohol use: Not Currently   Drug use: Never    Review of Systems  Constitutional: No fever/chills Eyes: No visual changes. ENT: No sore throat. Cardiovascular: Denies chest pain. Respiratory: Denies shortness of breath. Gastrointestinal: No abdominal pain.  Positive for nausea and vomiting.  No diarrhea.  No constipation. Genitourinary: Negative for dysuria. Musculoskeletal: Positive for back pain. Skin: Negative for rash. Neurological: Negative for headaches, focal weakness or  numbness.  ____________________________________________   PHYSICAL EXAM:  VITAL SIGNS: ED Triage Vitals  Enc Vitals Group     BP November 22, 2021 2122 (!) 63/43     Pulse Rate Nov 22, 2021 2116 (!) 114     Resp 2021-11-22 2116 20     Temp 11-22-21 2116 97.8 F (36.6 C)     Temp Source 2021/11/22 2116 Oral     SpO2 11/22/21 2116 96 %     Weight 11/22/2021 2119 160 lb (72.6 kg)     Height 22-Nov-2021 2119 5\' 7"  (1.702 m)     Head Circumference --      Peak Flow --      Pain Score 11-22-21 2119 0     Pain Loc --      Pain Edu? --      Excl. in GC? --     Constitutional: Alert and oriented. Eyes: Conjunctivae are normal. Head: Atraumatic. Nose: No congestion/rhinnorhea. Mouth/Throat: Mucous membranes are moist. Neck: Normal ROM Cardiovascular: Tachycardic, regular rhythm. Grossly normal heart sounds.  Diminished radial and DP pulses bilaterally. Respiratory: Normal respiratory effort.  No retractions. Lungs CTAB. Gastrointestinal: Soft and nontender. No distention. Genitourinary: deferred Musculoskeletal: No lower extremity tenderness nor edema.  No midline thoracic or lumbar spinal tenderness to palpation, no other tenderness noted to bilateral upper or lower back. Neurologic:  Normal speech and language. No gross focal neurologic deficits are appreciated. Skin:  Skin is warm, dry and intact. No rash noted.  Cyanotic appearing extremities. Psychiatric: Unable to assess.  ____________________________________________   LABS (all labs  ordered are listed, but only abnormal results are displayed)  Labs Reviewed  CBC WITH DIFFERENTIAL/PLATELET - Abnormal; Notable for the following components:      Result Value   WBC 19.9 (*)    Neutro Abs 14.3 (*)    Basophils Absolute 0.2 (*)    Abs Immature Granulocytes 0.14 (*)    All other components within normal limits  COMPREHENSIVE METABOLIC PANEL - Abnormal; Notable for the following components:   Potassium 3.0 (*)    CO2 21 (*)    Glucose, Bld  199 (*)    Creatinine, Ser 1.23 (*)    AST 42 (*)    GFR, Estimated 46 (*)    All other components within normal limits  LIPASE, BLOOD - Abnormal; Notable for the following components:   Lipase 58 (*)    All other components within normal limits  LACTIC ACID, PLASMA - Abnormal; Notable for the following components:   Lactic Acid, Venous 8.7 (*)    All other components within normal limits  RESP PANEL BY RT-PCR (FLU A&B, COVID) [id number]  CULTURE, BLOOD (ROUTINE X 2)  CULTURE, BLOOD (ROUTINE X 2)  MAGNESIUM  URINALYSIS, ROUTINE W REFLEX MICROSCOPIC  LACTIC ACID, PLASMA  TYPE AND SCREEN  TROPONIN I (HIGH SENSITIVITY)  TROPONIN I (HIGH SENSITIVITY)   ____________________________________________  EKG  ED ECG REPORT I, Chesley Noon, the attending physician, personally viewed and interpreted this ECG.   Date: 2021/11/18  EKG Time: 21:40  Rate: 104  Rhythm: sinus tachycardia, bigeminy  Axis: RAD  Intervals:none  ST&T Change: Mild inferolateral ST depressions  ED ECG REPORT I, Chesley Noon, the attending physician, personally viewed and interpreted this ECG.   Date: Nov 18, 2021  EKG Time: 22:13  Rate: 106  Rhythm: sinus tachycardia  Axis: RAD  Intervals:none  ST&T Change: Inferolateral ST depressions    PROCEDURES  Procedure(s) performed (including Critical Care):  .Critical Care Performed by: Chesley Noon, MD Authorized by: Chesley Noon, MD   Critical care provider statement:    Critical care time (minutes):  75   Critical care time was exclusive of:  Separately billable procedures and treating other patients and teaching time   Critical care was necessary to treat or prevent imminent or life-threatening deterioration of the following conditions:  Shock and circulatory failure   Critical care was time spent personally by me on the following activities:  Development of treatment plan with patient or surrogate, discussions with consultants, evaluation of  patient's response to treatment, examination of patient, ordering and review of laboratory studies, ordering and review of radiographic studies, ordering and performing treatments and interventions, pulse oximetry, re-evaluation of patient's condition and review of old charts   I assumed direction of critical care for this patient from another provider in my specialty: no     Care discussed with: accepting provider at another facility     ____________________________________________   INITIAL IMPRESSION / ASSESSMENT AND PLAN / ED COURSE      75 year old female with past medical history of hypertension presents to the ED with acute onset nausea, vomiting, and upper back pain earlier this evening.  Patient noted to be hypertensive in triage with what appears to be poor perfusion to all 4 extremities.  She was started on IV fluid bolus, EKG shows diffuse ST depressions concerning for global subendocardial ischemia but is not consistent with STEMI.  Due to acute shock and leukocytosis, patient was started on broad-spectrum antibiotics.  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  Patient's BP continues to be low but with heart rate elevated into the 120s, she was started on an esmolol drip.  Case discussed with Dr. Dorris Fetch of CT surgery at [facility], who accepts patient for transfer directly to preoperative area.  Patient remains awake and alert, answering questions appropriately, and we will hold off on endotracheal intubation.  [facility] will be transporting emergency traffic.      ____________________________________________   FINAL CLINICAL IMPRESSION(S) / ED DIAGNOSES  Final diagnoses:  Dissection of ascending aorta     ED Discharge Orders     None        Note:  This document was prepared using Dragon voice recognition software and may include unintentional dictation  errors.    Chesley Noon, MD Dec 06, 2021 2340

## 2021-11-08 NOTE — ED Notes (Signed)
Called [facility] for consult with [facility]

## 2021-11-08 NOTE — ED Notes (Signed)
Pts sister Mrs. Ermalinda Memos provided an update on the patient transfer to [facility].

## 2021-11-08 NOTE — ED Triage Notes (Signed)
Pt brought in by [facility] from home with co vomiting, also co right upper back pain that radiates to right shoulder. Denies any injury, CBG 236 per EMS.

## 2021-11-08 NOTE — ED Notes (Signed)
Pt taken to CT on monitor at this time and accompanied by RN.

## 2021-11-08 NOTE — ED Notes (Signed)
Lactic 8.7, EDP verbally notified.

## 2021-11-08 NOTE — Consult Note (Signed)
PHARMACY -  BRIEF ANTIBIOTIC NOTE   Pharmacy has received consult(s) for Vancomycin & Cefepime from an ED provider.  The patient's profile has been reviewed for ht/wt/allergies/indication/available labs.    One time order(s) placed for: Vancomycin 1g x1 Cefepime 2g x1  Further antibiotics/pharmacy consults should be ordered by admitting physician if indicated.                       Thank you, Martyn Malay 10/28/2021  10:19 PM

## 2021-11-08 NOTE — ED Notes (Signed)
Pt back from CT

## 2021-11-08 NOTE — Consult Note (Signed)
CODE SEPSIS - PHARMACY COMMUNICATION  **Broad Spectrum Antibiotics should be administered within 1 hour of Sepsis diagnosis**  Time Code Sepsis Called/Page Received: 2207  Antibiotics Ordered: 2215  Time of 1st antibiotic administration: 2223  Additional action taken by pharmacy: N/A  If necessary, Name of Provider/Nurse Contacted: N/A  Martyn Malay ,PharmD Clinical Pharmacist  10/29/2021  10:27 PM

## 2021-11-08 NOTE — Anesthesia Preprocedure Evaluation (Addendum)
Anesthesia Evaluation   Patient unresponsive  Preop documentation limited or incomplete due to emergent nature of procedure.  Airway Mallampati: Unable to assess       Dental  (+) Missing   Pulmonary  2021/11/11 SARS coronavirus NEG     unstable     Cardiovascular  Rhythm:Regular Rate:Bradycardia  dissection of the thoracic aorta :originates at the level of the aortic valve and extends throughout the ascending thoracic aorta, aortic arch, descending thoracic aorta, abdominal aorta and bilateral common iliac arteries. Involving the proximal portions of the bilateral external iliac arteries is also seen. The ascending thoracic aorta measures approximately 5.0 cm in diameter  A large, predominantly anterior pericardial effusion is seen. This measures approximately 2.4 cm in thickness   Neuro/Psych    GI/Hepatic   Endo/Other    Renal/GU Renal InsufficiencyRenal disease     Musculoskeletal   Abdominal   Peds  Hematology Hb 14.2, plt 183k   Anesthesia Other Findings   Reproductive/Obstetrics                           Anesthesia Physical Anesthesia Plan  ASA: 6 and emergent  Anesthesia Plan: General   Post-op Pain Management:    Induction: Intravenous  PONV Risk Score and Plan: 3 and Treatment may vary due to age or medical condition  Airway Management Planned: Oral ETT  Additional Equipment: Arterial line, PA Cath, TEE and Ultrasound Guidance Line Placement  Intra-op Plan:   Post-operative Plan: Post-operative intubation/ventilation  Informed Consent:     Only emergency history available  Plan Discussed with: CRNA  Anesthesia Plan Comments: (Pt bradycardic, hypotensive, unresponsive, gray, brought in by EMS. BVM vent and emergently to OR 17. [facility] called to OR)       Anesthesia Quick Evaluation

## 2021-11-08 NOTE — ED Notes (Signed)
This RN transported the patient to CT on a monitor and on 2L [place].

## 2021-11-08 NOTE — ED Notes (Signed)
EDP in room at this time 

## 2021-11-09 ENCOUNTER — Encounter (HOSPITAL_COMMUNITY)
Admission: EM | Disposition: E | Payer: Self-pay | Source: Other Acute Inpatient Hospital | Attending: Thoracic Surgery (Cardiothoracic Vascular Surgery)

## 2021-11-09 DIAGNOSIS — E872 Acidosis, unspecified: Secondary | ICD-10-CM | POA: Diagnosis not present

## 2021-11-09 DIAGNOSIS — I71019 Dissection of thoracic aorta, unspecified: Secondary | ICD-10-CM

## 2021-11-09 DIAGNOSIS — R579 Shock, unspecified: Secondary | ICD-10-CM | POA: Diagnosis not present

## 2021-11-09 DIAGNOSIS — I7101 Dissection of ascending aorta: Secondary | ICD-10-CM | POA: Diagnosis not present

## 2021-11-09 DIAGNOSIS — I469 Cardiac arrest, cause unspecified: Secondary | ICD-10-CM

## 2021-11-09 DIAGNOSIS — I314 Cardiac tamponade: Secondary | ICD-10-CM | POA: Diagnosis not present

## 2021-11-09 HISTORY — PX: CENTRAL VENOUS CATHETER INSERTION: SHX401

## 2021-11-09 SURGERY — INSERTION, CATHETER, CENTRAL VENOUS, ADULT
Anesthesia: General | Site: Groin | Laterality: Right

## 2021-11-09 MED ORDER — AMIODARONE IV BOLUS ONLY 150 MG/100ML
INTRAVENOUS | Status: DC | PRN
  Administered 2021-11-09: 150 mg via INTRAVENOUS

## 2021-11-09 MED ORDER — EPINEPHRINE PF 1 MG/ML IJ SOLN
INTRAMUSCULAR | Status: DC | PRN
  Administered 2021-11-08 – 2021-11-09 (×7): 1 mg via INTRAVENOUS

## 2021-11-09 MED ORDER — CEFAZOLIN SODIUM-DEXTROSE 2-4 GM/100ML-% IV SOLN: 2.0000 g | INTRAVENOUS | Status: AC

## 2021-11-09 MED ORDER — VASOPRESSIN 20 UNITS/100 ML INFUSION FOR SHOCK
0.0000 [IU]/min | INTRAVENOUS | Status: AC
Start: 2021-11-09 — End: 2021-11-10
  Filled 2021-11-09: qty 100

## 2021-11-09 MED ORDER — SUCCINYLCHOLINE CHLORIDE 200 MG/10ML IV SOSY
PREFILLED_SYRINGE | INTRAVENOUS | Status: DC | PRN
  Administered 2021-11-09: 100 mg via INTRAVENOUS

## 2021-11-09 MED ORDER — MANNITOL 20 % IV SOLN
INTRAVENOUS | Status: AC
  Filled 2021-11-09: qty 13

## 2021-11-09 MED ORDER — LACTATED RINGERS IV SOLN: INTRAVENOUS | Status: DC | PRN

## 2021-11-09 MED ORDER — VANCOMYCIN HCL 1250 MG/250ML IV SOLN
1250.0000 mg | INTRAVENOUS | Status: AC
  Filled 2021-11-09: qty 250

## 2021-11-09 MED ORDER — 0.9 % SODIUM CHLORIDE (POUR BTL) OPTIME
TOPICAL | Status: DC | PRN
  Administered 2021-11-09: 5000 mL

## 2021-11-09 SURGICAL SUPPLY — 108 items
ADAPTER CARDIO PERF ANTE/RETRO (ADAPTER) ×3 IMPLANT
BAG DECANTER FOR FLEXI CONT (MISCELLANEOUS) IMPLANT
BENZOIN TINCTURE PRP APPL 2/3 (GAUZE/BANDAGES/DRESSINGS) IMPLANT
BLADE CLIPPER SURG (BLADE) ×6 IMPLANT
BLADE STERNUM SYSTEM 6 (BLADE) ×3 IMPLANT
BLADE SURG 15 STRL LF DISP TIS (BLADE) ×2 IMPLANT
BLADE SURG 15 STRL SS (BLADE) ×1
BNDG ELASTIC 4X5.8 VLCR STR LF (GAUZE/BANDAGES/DRESSINGS) ×3 IMPLANT
BNDG ELASTIC 6X5.8 VLCR STR LF (GAUZE/BANDAGES/DRESSINGS) ×3 IMPLANT
BNDG GAUZE ELAST 4 BULKY (GAUZE/BANDAGES/DRESSINGS) ×3 IMPLANT
CANISTER SUCT 3000ML PPV (MISCELLANEOUS) ×6 IMPLANT
CANN PRFSN .5XCNCT 15X34-48 (MISCELLANEOUS)
CANNULA EZ GLIDE AORTIC 21FR (CANNULA) ×3 IMPLANT
CANNULA PRFSN .5XCNCT 15X34-48 (MISCELLANEOUS) IMPLANT
CANNULA VEN 2 STAGE (MISCELLANEOUS)
CATH CPB KIT HENDRICKSON (MISCELLANEOUS) ×3 IMPLANT
CATH ROBINSON RED A/P 18FR (CATHETERS) IMPLANT
CATH THORACIC 28FR (CATHETERS) IMPLANT
CATH THORACIC 36FR (CATHETERS) ×3 IMPLANT
CATH THORACIC 36FR RT ANG (CATHETERS) IMPLANT
CAUTERY EYE LOW TEMP 1300F FIN (OPHTHALMIC RELATED) IMPLANT
CLIP VESOCCLUDE MED 24/CT (CLIP) IMPLANT
CLIP VESOCCLUDE MED 6/CT (CLIP) IMPLANT
CLIP VESOCCLUDE SM WIDE 24/CT (CLIP) IMPLANT
CONN 3/8X3/8 GISH STERILE (MISCELLANEOUS) IMPLANT
CONN Y 3/8X3/8X3/8  BEN (MISCELLANEOUS)
CONN Y 3/8X3/8X3/8 BEN (MISCELLANEOUS) IMPLANT
CONTAINER PROTECT SURGISLUSH (MISCELLANEOUS) ×6 IMPLANT
DRAPE CARDIOVASCULAR INCISE (DRAPES) ×1
DRAPE SRG 135X102X78XABS (DRAPES) ×2 IMPLANT
DRAPE WARM FLUID 44X44 (DRAPES) ×3 IMPLANT
DRSG COVADERM 4X14 (GAUZE/BANDAGES/DRESSINGS) IMPLANT
ELECT REM PT RETURN 9FT ADLT (ELECTROSURGICAL) ×6
ELECTRODE REM PT RTRN 9FT ADLT (ELECTROSURGICAL) ×4 IMPLANT
FELT TEFLON 1X6 (MISCELLANEOUS) ×3 IMPLANT
FELT TEFLON 6X6 (MISCELLANEOUS) ×3 IMPLANT
GAUZE SPONGE 4X4 12PLY STRL (GAUZE/BANDAGES/DRESSINGS) IMPLANT
GLOVE SURG SIGNA 7.5 PF LTX (GLOVE) ×9 IMPLANT
GOWN STRL REUS W/ TWL LRG LVL3 (GOWN DISPOSABLE) ×6 IMPLANT
GOWN STRL REUS W/ TWL XL LVL3 (GOWN DISPOSABLE) ×2 IMPLANT
GOWN STRL REUS W/TWL LRG LVL3 (GOWN DISPOSABLE) ×3
GOWN STRL REUS W/TWL XL LVL3 (GOWN DISPOSABLE) ×1
HEMOSTAT POWDER SURGIFOAM 1G (HEMOSTASIS) ×6 IMPLANT
HEMOSTAT SURGICEL 2X14 (HEMOSTASIS) IMPLANT
INSERT FOGARTY SM (MISCELLANEOUS) IMPLANT
INSERT FOGARTY XLG (MISCELLANEOUS) IMPLANT
KIT BASIN OR (CUSTOM PROCEDURE TRAY) ×3 IMPLANT
KIT SUCTION CATH 14FR (SUCTIONS) IMPLANT
KIT TURNOVER KIT B (KITS) ×3 IMPLANT
KIT VASOVIEW HEMOPRO 2 VH 4000 (KITS) IMPLANT
MARKER GRAFT CORONARY BYPASS (MISCELLANEOUS) IMPLANT
NEEDLE AORTIC AIR ASPIRATING (NEEDLE) IMPLANT
NS IRRIG 1000ML POUR BTL (IV SOLUTION) ×15 IMPLANT
PACK E OPEN HEART (SUTURE) ×3 IMPLANT
PACK OPEN HEART (CUSTOM PROCEDURE TRAY) ×3 IMPLANT
PAD ARMBOARD 7.5X6 YLW CONV (MISCELLANEOUS) ×12 IMPLANT
PAD ELECT DEFIB RADIOL ZOLL (MISCELLANEOUS) ×3 IMPLANT
PENCIL BUTTON HOLSTER BLD 10FT (ELECTRODE) ×3 IMPLANT
POSITIONER HEAD DONUT 9IN (MISCELLANEOUS) IMPLANT
PUNCH AORTIC ROTATE 4.0MM (MISCELLANEOUS) IMPLANT
PUNCH AORTIC ROTATE 4.5MM 8IN (MISCELLANEOUS) IMPLANT
PUNCH AORTIC ROTATE 5MM 8IN (MISCELLANEOUS) IMPLANT
SEALANT SURG COSEAL 8ML (VASCULAR PRODUCTS) IMPLANT
SPONGE T-LAP 18X18 ~~LOC~~+RFID (SPONGE) ×12 IMPLANT
SPONGE T-LAP 4X18 ~~LOC~~+RFID (SPONGE) ×3 IMPLANT
STAPLER VISISTAT 35W (STAPLE) IMPLANT
STOPCOCK 4 WAY LG BORE MALE ST (IV SETS) ×3 IMPLANT
SUPPORT HEART JANKE-BARRON (MISCELLANEOUS) IMPLANT
SUT BONE WAX W31G (SUTURE) ×3 IMPLANT
SUT MNCRL AB 3-0 PS2 18 (SUTURE) IMPLANT
SUT MNCRL AB 4-0 PS2 18 (SUTURE) IMPLANT
SUT PROLENE 3 0 RB 1 (SUTURE) IMPLANT
SUT PROLENE 3 0 SH DA (SUTURE) ×6 IMPLANT
SUT PROLENE 4 0 RB 1 (SUTURE)
SUT PROLENE 4 0 SH DA (SUTURE) IMPLANT
SUT PROLENE 4-0 RB1 .5 CRCL 36 (SUTURE) IMPLANT
SUT PROLENE 6 0 C 1 30 (SUTURE) IMPLANT
SUT PROLENE 6 0 CC (SUTURE) IMPLANT
SUT PROLENE 7 0 BV 1 (SUTURE) IMPLANT
SUT PROLENE 7 0 BV1 MDA (SUTURE) ×3 IMPLANT
SUT PROLENE 8 0 BV175 6 (SUTURE) IMPLANT
SUT STEEL 6MS V (SUTURE) IMPLANT
SUT STEEL STERNAL CCS#1 18IN (SUTURE) IMPLANT
SUT STEEL SZ 6 DBL 3X14 BALL (SUTURE) ×3 IMPLANT
SUT VIC AB 1 CT1 18XCR BRD 8 (SUTURE) IMPLANT
SUT VIC AB 1 CT1 8-18 (SUTURE)
SUT VIC AB 1 CTX 27 (SUTURE) ×6 IMPLANT
SUT VIC AB 1 CTX 36 (SUTURE) ×2
SUT VIC AB 1 CTX36XBRD ANBCTR (SUTURE) ×4 IMPLANT
SUT VIC AB 2-0 CT1 27 (SUTURE)
SUT VIC AB 2-0 CT1 TAPERPNT 27 (SUTURE) IMPLANT
SUT VIC AB 2-0 CTX 27 (SUTURE) IMPLANT
SUT VIC AB 2-0 CTX 36 (SUTURE) ×6 IMPLANT
SUT VIC AB 3-0 SH 27 (SUTURE)
SUT VIC AB 3-0 SH 27X BRD (SUTURE) IMPLANT
SUT VIC AB 3-0 X1 27 (SUTURE) ×6 IMPLANT
SUT VICRYL 4-0 PS2 18IN ABS (SUTURE) IMPLANT
SYSTEM SAHARA CHEST DRAIN ATS (WOUND CARE) ×6 IMPLANT
TOWEL GREEN STERILE (TOWEL DISPOSABLE) ×6 IMPLANT
TOWEL GREEN STERILE FF (TOWEL DISPOSABLE) ×6 IMPLANT
TRAY CATH LUMEN 1 20CM STRL (SET/KITS/TRAYS/PACK) IMPLANT
TRAY FOLEY SLVR 14FR TEMP STAT (SET/KITS/TRAYS/PACK) IMPLANT
TRAY FOLEY SLVR 16FR TEMP STAT (SET/KITS/TRAYS/PACK) IMPLANT
TUBE CONNECTING 20X1/4 (TUBING) IMPLANT
TUBING LAP HI FLOW INSUFFLATIO (TUBING) IMPLANT
UNDERPAD 30X36 HEAVY ABSORB (UNDERPADS AND DIAPERS) ×3 IMPLANT
WATER STERILE IRR 1000ML POUR (IV SOLUTION) ×12 IMPLANT
YANKAUER SUCT BULB TIP NO VENT (SUCTIONS) IMPLANT

## 2021-11-11 ENCOUNTER — Encounter (HOSPITAL_COMMUNITY): Payer: Self-pay | Admitting: Thoracic Surgery (Cardiothoracic Vascular Surgery)

## 2021-11-11 NOTE — Death Summary Note (Signed)
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 [facility] Course (including significant findings, care, treatment, and services provided and events leading to death)  Crystal Bartlett is a 75 y.o. year old female who has a past history significant for hypertension.  She presented to the [facility] ED with acute onset of back pain.  She was hypotensive tachycardic and in shock with severe lactic acidosis.  She had a CT which showed a type I aortic dissection.  [facility].  She was transferred directly to the operating room to avoid delay, but was pulseless and cyanotic on arrival.  She lost consciousness as the EMS arrived in the parking lot.  CPR was initiated, but futile.    Pertinent Labs and Studies  Significant Diagnostic Studies DG Chest Portable 1 View  Result Date: 21-Nov-2021 CLINICAL DATA:  Sepsis EXAM: PORTABLE CHEST 1 VIEW COMPARISON:  None. FINDINGS: Streaky opacities at both medial lung bases. Mild cardiomegaly. No pleural effusion or pneumothorax. IMPRESSION: Mild cardiomegaly and streaky bibasilar opacities, likely atelectasis. Electronically Signed   By: Ulyses Jarred M.D.   On: 21-Nov-2021 22:30   CT Angio Chest/Abd/Pel for Dissection W and/or W/WO  Addendum Date: 11/22/21   ADDENDUM REPORT: 11/22/2021 00:27 ADDENDUM: It should be noted that the thoracic aortic dissection described in the body of the original report extends to involve the origins and proximal  portions of the brachiocephalic trunk, left common carotid artery and left subclavian artery (axial CT images 13 through 20, CT series 8). Electronically Signed   By: Virgina Norfolk M.D.   On: 22-Nov-2021 00:27   Result Date: 2021/11/22 CLINICAL DATA:  Upper right back pain and vomiting. EXAM: CT ANGIOGRAPHY CHEST, ABDOMEN AND PELVIS TECHNIQUE: Non-contrast CT of the chest was initially obtained. Multidetector CT imaging through the chest, abdomen and pelvis was performed using the standard protocol during bolus administration of intravenous contrast. Multiplanar reconstructed images and MIPs were obtained and reviewed to evaluate the vascular anatomy. CONTRAST:  154mL OMNIPAQUE IOHEXOL 350 MG/ML SOLN COMPARISON:  None. FINDINGS: CTA CHEST FINDINGS Cardiovascular: There is dissection of the thoracic aorta. This originates at the level of the aortic valve and extends throughout the ascending thoracic aorta, aortic arch, descending thoracic aorta, abdominal aorta and bilateral common iliac arteries. Involving the proximal portions of the bilateral external iliac arteries is also seen. The ascending thoracic aorta measures approximately 5.0 cm in diameter. The pulmonary arteries are limited in evaluation secondary to suboptimal opacification with intravenous contrast. No true intraluminal filling defects are identified. The heart is normal in size. A large, predominantly anterior pericardial effusion is seen. This measures approximately 2.4 cm in thickness and extends superiorly into the superior mediastinum. Mediastinum/Nodes: No enlarged mediastinal, hilar, or axillary lymph nodes. Thyroid gland, trachea, and esophagus demonstrate no significant findings. Lungs/Pleura: Mild atelectasis is seen within the posterior aspects of the bilateral lower lobes. There is no evidence of acute infiltrate, pleural effusion or pneumothorax. Musculoskeletal: Degenerative changes seen throughout the thoracic  spine. Review of the  MIP images confirms the above findings. CTA ABDOMEN AND PELVIS FINDINGS VASCULAR Aorta: Extensive dissection of a normal caliber aorta, as described above. Celiac: Patent without evidence of aneurysm, dissection, vasculitis or significant stenosis. SMA: Patent without evidence of aneurysm, dissection, vasculitis or significant stenosis. Renals: Both renal arteries are patent without evidence of aneurysm, dissection, vasculitis, fibromuscular dysplasia or significant stenosis. IMA: Patent without evidence of aneurysm, dissection, vasculitis or significant stenosis. Inflow: Patent with extensive dissection to the level of the proximal portions of the bilateral external iliac arteries. Veins: No obvious venous abnormality within the limitations of this arterial phase study. Review of the MIP images confirms the above findings. NON-VASCULAR Hepatobiliary: A 1.1 cm cyst is seen within the posterior aspect of the right lobe of the liver multiple similar appearing cystic areas are seen scattered throughout the right lobe. The largest measures approximately 1.7 cm x 1.2 cm. No gallstones, gallbladder wall thickening, or biliary dilatation. Pancreas: Unremarkable. No pancreatic ductal dilatation or surrounding inflammatory changes. Spleen: Normal in size without focal abnormality. Adrenals/Urinary Tract: Adrenal glands are unremarkable. Kidneys are normal, without renal calculi, focal lesion, or hydronephrosis. The urinary bladder is contracted and subsequently limited in evaluation. Stomach/Bowel: Stomach is within normal limits. The appendix is not clearly identified. A large amount of stool is seen within the distal sigmoid colon and rectum. No evidence of bowel wall thickening, distention, or inflammatory changes. Lymphatic: Multiple subcentimeter para-aortic and aortocaval lymph nodes are seen. Reproductive: Uterus and bilateral adnexa are unremarkable. Other: No abdominal wall hernia or abnormality. No abdominopelvic  ascites. Musculoskeletal: Degenerative changes seen throughout the lumbar spine. Review of the MIP images confirms the above findings. IMPRESSION: 1. 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 2. Large, predominantly anterior pericardial effusion. 3. Multiple simple hepatic cysts. 4. Aortic atherosclerosis. Aortic Atherosclerosis (ICD10-I70.0). Electronically Signed: By: Virgina Norfolk M.D. On: 11/01/2021 23:16    Microbiology Recent Results (from the past 240 hour(s))  Resp Panel by RT-PCR (Flu A&B, Covid) Nasopharyngeal Swab     Status: None   Collection Time: 11/01/2021  9:39 PM   Specimen: Nasopharyngeal Swab; Nasopharyngeal(NP) swabs in vial transport medium  Result Value Ref Range Status   SARS Coronavirus 2 by RT PCR NEGATIVE NEGATIVE Final    Comment: (NOTE) SARS-CoV-2 target nucleic acids are NOT DETECTED.  The SARS-CoV-2 RNA is generally detectable in upper respiratory specimens during the acute phase of infection. The lowest concentration of SARS-CoV-2 viral copies this assay can detect is 138 copies/mL. A negative result does not preclude SARS-Cov-2 infection and should not be used as the sole basis for treatment or other patient management decisions. A negative result may occur with  improper specimen collection/handling, submission of specimen other than nasopharyngeal swab, presence of viral mutation(s) within the areas targeted by this assay, and inadequate number of viral copies(<138 copies/mL). A negative result must be combined with clinical observations, patient history, and epidemiological information. The expected result is Negative.  Fact Sheet for Patients:  EntrepreneurPulse.com.au  Fact Sheet for Healthcare Providers:  IncredibleEmployment.be  This test is no t yet  approved or cleared by the [place] FDA and  has been authorized for detection and/or diagnosis of SARS-CoV-2 by FDA under an Emergency Use Authorization (EUA). This EUA will remain  in effect (meaning this test can be used) for the duration  of the COVID-19 declaration under Section 564(b)(1) of the Act, 21 U.S.C.section 360bbb-3(b)(1), unless the authorization is terminated  or revoked sooner.       Influenza A by PCR NEGATIVE NEGATIVE Final   Influenza B by PCR NEGATIVE NEGATIVE Final    Comment: (NOTE) The Xpert Xpress SARS-CoV-2/FLU/RSV plus assay is intended as an aid in the diagnosis of influenza from Nasopharyngeal swab specimens and should not be used as a sole basis for treatment. Nasal washings and aspirates are unacceptable for Xpert Xpress SARS-CoV-2/FLU/RSV testing.  Fact Sheet for Patients: BloggerCourse.com  Fact Sheet for Healthcare Providers: SeriousBroker.it  This test is not yet approved or cleared by the [place] FDA and has been authorized for detection and/or diagnosis of SARS-CoV-2 by FDA under an Emergency Use Authorization (EUA). This EUA will remain in effect (meaning this test can be used) for the duration of the COVID-19 declaration under Section 564(b)(1) of the Act, 21 U.S.C. section 360bbb-3(b)(1), unless the authorization is terminated or revoked.  Performed at [facility], [address] [place], [place] [address]     Lab Basic Metabolic Panel: Recent Labs  Lab 10/31/2021 2138 10/27/2021 2211  NA 137  --   K 3.0*  --   CL 102  --   CO2 21*  --   GLUCOSE 199*  --   BUN 21  --   CREATININE 1.23*  --   CALCIUM 9.7  --   MG  --  2.4   Liver Function Tests: Recent Labs  Lab 10/29/2021 2138  AST 42*  ALT 20  ALKPHOS 67  BILITOT 0.9  PROT 6.8  ALBUMIN 3.7   Recent Labs  Lab 11/06/2021 2138  LIPASE 58*   No results for input(s): AMMONIA in the last 168  hours. CBC: Recent Labs  Lab 11/02/2021 2138  WBC 19.9*  NEUTROABS 14.3*  HGB 14.2  HCT 44.4  MCV 89.9  PLT 183   Cardiac Enzymes: No results for input(s): CKTOTAL, CKMB, CKMBINDEX, TROPONINI in the last 168 hours. Sepsis Labs: Recent Labs  Lab 10/20/2021 2138 11/04/2021 2204  WBC 19.9*  --   LATICACIDVEN  --  8.7*    Procedures/Operations  Central venous catheter placement-Dr. Jairo Ben Femoral arterial catheter placement-Dr. Truitt Merle 2021/11/10, 1:07 AM

## 2021-11-11 NOTE — Anesthesia Procedure Notes (Signed)
Central Venous Catheter Insertion Performed by: Annye Asa, MD, anesthesiologist Start/End2022-12-13 12:14 AM, Nov 23, 2021 12:20 AM Patient location: OR. Emergency situation Preanesthetic checklist: patient identified, IV checked and monitors and equipment checked Position: supine Hand hygiene performed , maximum sterile barriers used  and Seldinger technique used Catheter size: 8.5 Fr Central line was placed.Sheath introducer Procedure performed using ultrasound guided technique. Ultrasound Notes:anatomy identified, needle tip was noted to be adjacent to the nerve/plexus identified and no ultrasound evidence of intravascular and/or intraneural injection Attempts: 2 Following insertion, dressing applied. Post procedure assessment: blood return through all ports, free fluid flow and no air  Additional procedure comments: CVP: Timeout, sterile prep, drape, FBP R neck.  Supine position with CPR in progress. trocar RIJ 1st pass with US guidance, unable to pass J wire x2,  Re-prep, drape L neck, trocar LIJ 1st pass with US guidance, Cordis introducer placed over J wire. sterile dressing on. CPR in progress throughout.  Jenita Seashore, MD .

## 2021-11-11 NOTE — Progress Notes (Signed)
Chaplain responded to page to accompany physician as he told the family their loved one had died.  Chaplain offered ministry of presence with pt's sister as she came to grips with the fact her sister was no longer alive. Chaplain waited with pt's sister for other family members to arrive. Chaplain met family in ED and escorted them all to see their sister.  At sister's bedside Chaplain offered scripture reading, prayers of commendation and thanksgiving for the life of this much loved woman.  [place]

## 2021-11-11 NOTE — H&P (Signed)
Crystal Bartlett is an 75 y.o. female.   Chief Complaint: Back pain HPI: History obtained from chart.  Patient unresponsive and pulseless on arrival.  Crystal Bartlett is a 75 year old woman with a history of hypertension who presented to the [facility] ED with vomiting and back pain.  She denied chest or abdominal pain.  She was found to be tachycardic and hypotensive.  Her blood pressure on arrival there was 63/43.  She was started on IV antibiotics for possible sepsis and CT of the chest abdomen and pelvis was obtained.  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  There also was a pericardial effusion.  I was called accepted her in transfer directly to the OR.  On arrival to the OR she was cyanotic and pulseless and unresponsive.  CPR was initiated.  No past medical history on file.  No past surgical history on file.  Family History  Problem Relation Age of Onset   Breast cancer Sister    Social History:  reports that she has never smoked. She has never used smokeless tobacco. She reports that she does not currently use alcohol. She reports that she does not use drugs.  Allergies: No Known Allergies  Medications Prior to Admission  Medication Sig Dispense Refill   acetaminophen (TYLENOL) 325 MG tablet Take 650 mg by mouth every 6 (six) hours as needed.      Results for orders placed or performed during the hospital encounter of 10/13/2021 (from the past 48 hour(s))  CBC with Differential     Status: Abnormal   Collection Time: 11/10/2021  9:38 PM  Result Value Ref Range   WBC 19.9 (H) 4.0 - 10.5 K/uL   RBC 4.94 3.87 - 5.11 MIL/uL   Hemoglobin 14.2 12.0 - 15.0 g/dL   HCT 44.4 36.0 - 46.0 %   MCV 89.9 80.0 - 100.0 fL   MCH 28.7 26.0 - 34.0 pg   MCHC 32.0 30.0 - 36.0 g/dL   RDW 13.2 11.5 - 15.5 %   Platelets 183 150 - 400 K/uL   nRBC 0.0 0.0 - 0.2 %   Neutrophils Relative % 71 %   Neutro Abs 14.3 (H) 1.7 - 7.7 K/uL   Lymphocytes  Relative 20 %   Lymphs Abs 3.9 0.7 - 4.0 K/uL   Monocytes Relative 5 %   Monocytes Absolute 1.0 0.1 - 1.0 K/uL   Eosinophils Relative 2 %   Eosinophils Absolute 0.4 0.0 - 0.5 K/uL   Basophils Relative 1 %   Basophils Absolute 0.2 (H) 0.0 - 0.1 K/uL   Immature Granulocytes 1 %   Abs Immature Granulocytes 0.14 (H) 0.00 - 0.07 K/uL    Comment: Performed at [facility], [address] [place], [place] [address]  Comprehensive metabolic panel     Status: Abnormal   Collection Time: 10/19/2021  9:38 PM  Result Value Ref Range   Sodium 137 135 - 145 mmol/L   Potassium 3.0 (L) 3.5 - 5.1 mmol/L   Chloride 102 98 - 111 mmol/L   CO2 21 (L) 22 - 32 mmol/L   Glucose, Bld 199 (H) 70 - 99 mg/dL    Comment: Glucose reference range applies only to samples taken after fasting for at least 8 hours.   BUN 21 8 - 23 mg/dL   Creatinine, Ser 1.23 (H) 0.44 - 1.00 mg/dL   Calcium 9.7 8.9 - 10.3 mg/dL   Total  Protein 6.8 6.5 - 8.1 g/dL   Albumin 3.7 3.5 - 5.0 g/dL   AST 42 (H) 15 - 41 U/L   ALT 20 0 - 44 U/L   Alkaline Phosphatase 67 38 - 126 U/L   Total Bilirubin 0.9 0.3 - 1.2 mg/dL   GFR, Estimated 46 (L) >60 mL/min    Comment: (NOTE) Calculated using the CKD-EPI Creatinine Equation (2021)    Anion gap 14 5 - 15    Comment: Performed at [facility], [address] [place], [place] [address]  Lipase, blood     Status: Abnormal   Collection Time: 10/21/2021  9:38 PM  Result Value Ref Range   Lipase 58 (H) 11 - 51 U/L    Comment: Performed at [facility], [place], [place] [address]  Troponin I (High Sensitivity)     Status: None   Collection Time: 10/31/2021  9:38 PM  Result Value Ref Range   Troponin I (High Sensitivity) 5 <18 ng/L    Comment: (NOTE) Elevated high sensitivity troponin I (hsTnI) values and significant  changes across serial measurements may suggest ACS but many other  chronic and acute conditions are known to elevate hsTnI results.   Refer to the "Links" section for chest pain algorithms and additional  guidance. Performed at [facility], [address] [place], [place] [address]   Resp Panel by RT-PCR (Flu A&B, Covid) Nasopharyngeal Swab     Status: None   Collection Time: 10/17/2021  9:39 PM   Specimen: Nasopharyngeal Swab; Nasopharyngeal(NP) swabs in vial transport medium  Result Value Ref Range   SARS Coronavirus 2 by RT PCR NEGATIVE NEGATIVE    Comment: (NOTE) SARS-CoV-2 target nucleic acids are NOT DETECTED.  The SARS-CoV-2 RNA is generally detectable in upper respiratory specimens during the acute phase of infection. The lowest concentration of SARS-CoV-2 viral copies this assay can detect is 138 copies/mL. A negative result does not preclude SARS-Cov-2 infection and should not be used as the sole basis for treatment or other patient management decisions. A negative result may occur with  improper specimen collection/handling, submission of specimen other than nasopharyngeal swab, presence of viral mutation(s) within the areas targeted by this assay, and inadequate number of viral copies(<138 copies/mL). A negative result must be combined with clinical observations, patient history, and epidemiological information. The expected result is Negative.  Fact Sheet for Patients:  EntrepreneurPulse.com.au  Fact Sheet for Healthcare Providers:  IncredibleEmployment.be  This test is no t yet approved or cleared by the [place] FDA and  has been authorized for detection and/or diagnosis of SARS-CoV-2 by FDA under an Emergency Use Authorization (EUA). This EUA will remain  in effect (meaning this test can be used) for the duration of the COVID-19 declaration under Section 564(b)(1) of the Act, 21 U.S.C.section 360bbb-3(b)(1), unless the authorization is terminated  or revoked sooner.       Influenza A by PCR NEGATIVE NEGATIVE   Influenza B by PCR NEGATIVE  NEGATIVE    Comment: (NOTE) The Xpert Xpress SARS-CoV-2/FLU/RSV plus assay is intended as an aid in the diagnosis of influenza from Nasopharyngeal swab specimens and should not be used as a sole basis for treatment. Nasal washings and aspirates are unacceptable for Xpert Xpress SARS-CoV-2/FLU/RSV testing.  Fact Sheet for Patients: EntrepreneurPulse.com.au  Fact Sheet for Healthcare Providers: IncredibleEmployment.be  This test is not yet approved or cleared by the [place] FDA and has been authorized for detection and/or diagnosis of  SARS-CoV-2 by FDA under an Emergency Use Authorization (EUA). This EUA will remain in effect (meaning this test can be used) for the duration of the COVID-19 declaration under Section 564(b)(1) of the Act, 21 U.S.C. section 360bbb-3(b)(1), unless the authorization is terminated or revoked.  Performed at [facility], [address] [place], [place] [address]   Lactic acid, plasma     Status: Abnormal   Collection Time: 10/17/2021 10:04 PM  Result Value Ref Range   Lactic Acid, Venous 8.7 (HH) 0.5 - 1.9 mmol/L    Comment: CRITICAL RESULT CALLED TO, READ BACK BY AND VERIFIED WITH HEATHER LEE RN 2255 11/01/2021 HNM Performed at [facility], [address] [place], [place] [address]   Magnesium     Status: None   Collection Time: 11/07/2021 10:11 PM  Result Value Ref Range   Magnesium 2.4 1.7 - 2.4 mg/dL    Comment: Performed at [facility], [address] [place], [place] [address]  Type and screen [facility]     Status: None   Collection Time: 10/13/2021 10:57 PM  Result Value Ref Range   ABO/RH(D) AB NEG    Antibody Screen NEG    Sample Expiration      11/11/2021,2359 Performed at [facility], [address] [place], [place] [address]    DG Chest Portable 1 View  Result Date: 10/18/2021 CLINICAL DATA:  Sepsis EXAM: PORTABLE CHEST 1 VIEW  COMPARISON:  None. FINDINGS: Streaky opacities at both medial lung bases. Mild cardiomegaly. No pleural effusion or pneumothorax. IMPRESSION: Mild cardiomegaly and streaky bibasilar opacities, likely atelectasis. Electronically Signed   By: Deatra Robinson M.D.   On: 10/21/2021 22:30   CT Angio Chest/Abd/Pel for Dissection W and/or W/WO  Addendum Date: Nov 11, 2021   ADDENDUM REPORT: 2021/11/11 00:27 ADDENDUM: It should be noted that the thoracic aortic dissection described in the body of the original report extends to involve the origins and proximal portions of the brachiocephalic trunk, left common carotid artery and left subclavian artery (axial CT images 13 through 20, CT series 8). Electronically Signed   By: Aram Candela M.D.   On: 2021-11-11 00:27   Result Date: 2021-11-11 CLINICAL DATA:  Upper right back pain and vomiting. EXAM: CT ANGIOGRAPHY CHEST, ABDOMEN AND PELVIS TECHNIQUE: Non-contrast CT of the chest was initially obtained. Multidetector CT imaging through the chest, abdomen and pelvis was performed using the standard protocol during bolus administration of intravenous contrast. Multiplanar reconstructed images and MIPs were obtained and reviewed to evaluate the vascular anatomy. CONTRAST:  OMNIPAQUE IOHEXOL 350 MG/ML SOLN COMPARISON:  None. FINDINGS: CTA CHEST FINDINGS Cardiovascular: There is dissection of the thoracic aorta. This originates at the level of the aortic valve and extends throughout the ascending thoracic aorta, aortic arch, descending thoracic aorta, abdominal aorta and bilateral common iliac arteries. Involving the proximal portions of the bilateral external iliac arteries is also seen. The ascending thoracic aorta measures approximately 5.0 cm in diameter. The pulmonary arteries are limited in evaluation secondary to suboptimal opacification with intravenous contrast. No true intraluminal filling defects are identified. The heart is normal in size. A large,  predominantly anterior pericardial effusion is seen. This measures approximately 2.4 cm in thickness and extends superiorly into the superior mediastinum. Mediastinum/Nodes: No enlarged mediastinal, hilar, or axillary lymph nodes. Thyroid gland, trachea, and esophagus demonstrate no significant findings. Lungs/Pleura: Mild atelectasis is seen within the posterior aspects of the bilateral lower lobes. There is no evidence of acute  infiltrate, pleural effusion or pneumothorax. Musculoskeletal: Degenerative changes seen throughout the thoracic spine. Review of the MIP images confirms the above findings. CTA ABDOMEN AND PELVIS FINDINGS VASCULAR Aorta: Extensive dissection of a normal caliber aorta, as described above. Celiac: Patent without evidence of aneurysm, dissection, vasculitis or significant stenosis. SMA: Patent without evidence of aneurysm, dissection, vasculitis or significant stenosis. Renals: Both renal arteries are patent without evidence of aneurysm, dissection, vasculitis, fibromuscular dysplasia or significant stenosis. IMA: Patent without evidence of aneurysm, dissection, vasculitis or significant stenosis. Inflow: Patent with extensive dissection to the level of the proximal portions of the bilateral external iliac arteries. Veins: No obvious venous abnormality within the limitations of this arterial phase study. Review of the MIP images confirms the above findings. NON-VASCULAR Hepatobiliary: A 1.1 cm cyst is seen within the posterior aspect of the right lobe of the liver multiple similar appearing cystic areas are seen scattered throughout the right lobe. The largest measures approximately 1.7 cm x 1.2 cm. No gallstones, gallbladder wall thickening, or biliary dilatation. Pancreas: Unremarkable. No pancreatic ductal dilatation or surrounding inflammatory changes. Spleen: Normal in size without focal abnormality. Adrenals/Urinary Tract: Adrenal glands are unremarkable. Kidneys are normal, without  renal calculi, focal lesion, or hydronephrosis. The urinary bladder is contracted and subsequently limited in evaluation. Stomach/Bowel: Stomach is within normal limits. The appendix is not clearly identified. A large amount of stool is seen within the distal sigmoid colon and rectum. No evidence of bowel wall thickening, distention, or inflammatory changes. Lymphatic: Multiple subcentimeter para-aortic and aortocaval lymph nodes are seen. Reproductive: Uterus and bilateral adnexa are unremarkable. Other: No abdominal wall hernia or abnormality. No abdominopelvic ascites. Musculoskeletal: Degenerative changes seen throughout the lumbar spine. Review of the MIP images confirms the above findings. IMPRESSION: 1. 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 2. Large, predominantly anterior pericardial effusion. 3. Multiple simple hepatic cysts. 4. Aortic atherosclerosis. Aortic Atherosclerosis (ICD10-I70.0). Electronically Signed: By: Virgina Norfolk M.D. On: 11/03/2021 23:16    Review of Systems  Unable to perform ROS: Patient unresponsive   There were no vitals taken for this visit. Physical Exam  Unresponsive Cyanotic Pulseless  Assessment/Plan Seymone Blew is a 75 year old woman with a history of hypertension who presented to the [facility] ED with new onset severe back pain.  She was found to have a type I aortic dissection.  She was acidotic and in shock.  She was transported to [facility] for possible surgery, but was in PEA arrest on arrival.  CPR was initiated.  There was a good palpable pulse with compressions.  She was resuscitated with epinephrine and bicarbonate.  She had transient ROSC, but quickly deteriorated.  She required multiple defibrillations.  Arterial and central venous catheters were placed and she was intubated during the  resuscitation.  Resuscitation attempts continued for over 30 minutes with no sustained ROSC.  She became progressively less responsive to interventions.  She was pronounced dead at 12:34 AM.  Melrose Nakayama, MD 11-25-21, 12:58 AM

## 2021-11-11 NOTE — Transfer of Care (Signed)
Immediate Anesthesia Transfer of Care Note  Patient: Crystal Bartlett  Procedure(s) Performed: REPAIR ASCENDING AORTIC ANEURYSM (Chest)  Patient Location: pt deceased  Anesthesia Type:pt deceased  Level of Consciousness: pt deceased  Airway & Oxygen Therapy: pt deceased  Post-op Assessment: pt deceaseed  Post vital signs: pt deceased  Last Vitals:  Vitals Value Taken Time  BP    Temp    Pulse    Resp    SpO2      Last Pain: There were no vitals filed for this visit.       Complications: No notable events documented.

## 2021-11-11 NOTE — Anesthesia Procedure Notes (Addendum)
Procedure Name: Intubation Date/Time: 2021-12-07 12:07 AM Performed by: Molli Hazard, CRNA Pre-anesthesia Checklist: Patient identified, Emergency Drugs available, Suction available and Patient being monitored Patient Re-evaluated:Patient Re-evaluated prior to induction Oxygen Delivery Method: Circle System Utilized Preoxygenation: Pre-oxygenation with 100% oxygen (CPR in progress) Laryngoscope Size: Glidescope and 4 Grade View: Grade II Tube type: Oral Tube size: 8.0 mm Number of attempts: 1 Airway Equipment and Method: Stylet and Video-laryngoscopy Placement Confirmation: ETT inserted through vocal cords under direct vision, positive ETCO2 and breath sounds checked- equal and bilateral Secured at: 22 cm Tube secured with: Tape Dental Injury: Teeth and Oropharynx as per pre-operative assessment

## 2021-11-11 NOTE — Anesthesia Postprocedure Evaluation (Signed)
Anesthesia Post Note  Patient: Crystal Bartlett  Procedure(s) Performed: REPAIR ASCENDING AORTIC ANEURYSM (Chest)    Pt deceased              Delina Kruczek,E. Brennyn Haisley

## 2021-11-11 DEATH — deceased

## 2021-11-13 LAB — CULTURE, BLOOD (ROUTINE X 2): Culture: NO GROWTH

## 2021-11-15 LAB — CULTURE, BLOOD (ROUTINE X 2)
Culture: NO GROWTH
Special Requests: ADEQUATE

## 2021-12-01 MED FILL — Fentanyl Citrate Preservative Free (PF) Inj 100 MCG/2ML: INTRAMUSCULAR | Qty: 1 | Status: AC

## 2022-02-09 IMAGING — DX DG CHEST 1V PORT
1 series · 1 of 1 positions shown · non-contrast
Comparison: None.

CLINICAL DATA: Sepsis

EXAM:
PORTABLE CHEST 1 VIEW

[chest ap]
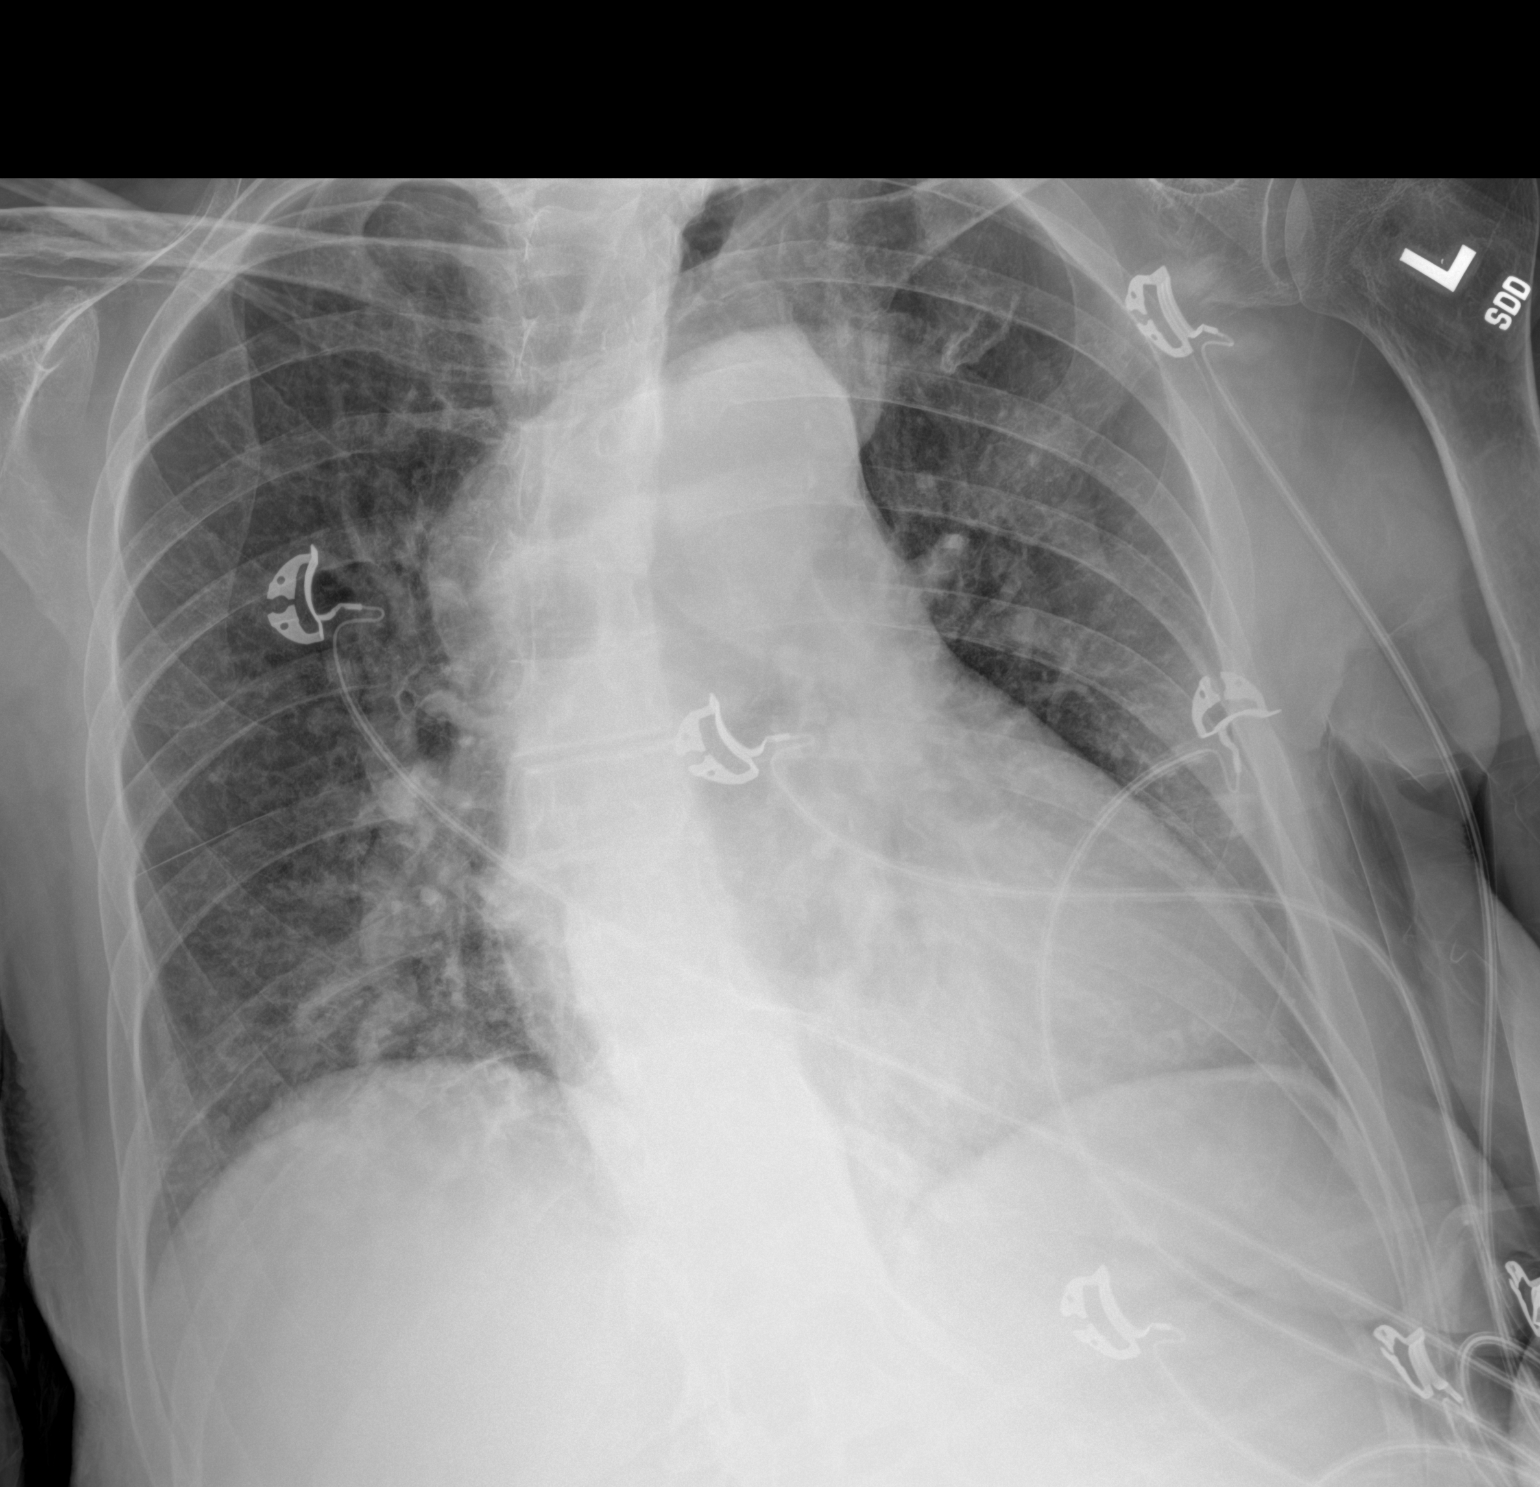

[1 of 1 positions shown; findings below may reference images not displayed]

FINDINGS: Streaky opacities at both medial lung bases. Mild cardiomegaly. No
pleural effusion or pneumothorax.
IMPRESSION: Mild cardiomegaly and streaky bibasilar opacities, likely
atelectasis.

## 2022-03-07 ENCOUNTER — Ambulatory Visit: Payer: Medicare Other | Admitting: Podiatry
